# Patient Record
Sex: Female | Born: 1955 | Race: White | Hispanic: No | Marital: Single | Smoking: Never smoker
Health system: Southern US, Community
[De-identification: ages and names within clinical notes are randomized; demographics above are authoritative.]

## PROBLEM LIST (undated history)

## (undated) DIAGNOSIS — J45909 Unspecified asthma, uncomplicated: Secondary | ICD-10-CM

## (undated) DIAGNOSIS — I10 Essential (primary) hypertension: Secondary | ICD-10-CM

## (undated) DIAGNOSIS — E669 Obesity, unspecified: Secondary | ICD-10-CM

## (undated) DIAGNOSIS — E119 Type 2 diabetes mellitus without complications: Secondary | ICD-10-CM

## (undated) HISTORY — DX: Obesity, unspecified: E66.9

## (undated) HISTORY — DX: Essential (primary) hypertension: I10

## (undated) HISTORY — DX: Unspecified asthma, uncomplicated: J45.909

## (undated) HISTORY — DX: Type 2 diabetes mellitus without complications: E11.9

---
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
## 2008-01-30 ENCOUNTER — Encounter: Admission: RE | Admit: 2008-01-30 | Discharge: 2008-01-30 | Payer: Self-pay | Admitting: Family Medicine

## 2008-02-12 ENCOUNTER — Encounter: Admission: RE | Admit: 2008-02-12 | Discharge: 2008-02-12 | Payer: Self-pay | Admitting: Family Medicine

## 2008-08-05 ENCOUNTER — Encounter: Admission: RE | Admit: 2008-08-05 | Discharge: 2008-08-05 | Payer: Self-pay | Admitting: Family Medicine

## 2009-02-24 ENCOUNTER — Encounter: Admission: RE | Admit: 2009-02-24 | Discharge: 2009-02-24 | Payer: Self-pay | Admitting: Family Medicine

## 2010-03-10 ENCOUNTER — Encounter: Admission: RE | Admit: 2010-03-10 | Discharge: 2010-03-10 | Payer: Self-pay | Admitting: Family Medicine

## 2011-05-11 ENCOUNTER — Other Ambulatory Visit: Payer: Self-pay | Admitting: Family Medicine

## 2011-05-11 DIAGNOSIS — Z1231 Encounter for screening mammogram for malignant neoplasm of breast: Secondary | ICD-10-CM

## 2011-05-22 ENCOUNTER — Ambulatory Visit
Admission: RE | Admit: 2011-05-22 | Discharge: 2011-05-22 | Disposition: A | Discharge status: Home / Self Care | Payer: BC Managed Care – PPO | Source: Ambulatory Visit | Attending: Family Medicine | Admitting: Family Medicine

## 2011-05-22 DIAGNOSIS — Z1231 Encounter for screening mammogram for malignant neoplasm of breast: Secondary | ICD-10-CM

## 2012-06-11 ENCOUNTER — Other Ambulatory Visit: Payer: Self-pay | Admitting: Family Medicine

## 2012-06-11 DIAGNOSIS — Z1231 Encounter for screening mammogram for malignant neoplasm of breast: Secondary | ICD-10-CM

## 2012-07-25 ENCOUNTER — Ambulatory Visit
Admission: RE | Admit: 2012-07-25 | Discharge: 2012-07-25 | Disposition: A | Discharge status: Home / Self Care | Payer: BC Managed Care – PPO | Source: Ambulatory Visit | Attending: Family Medicine | Admitting: Family Medicine

## 2012-07-25 DIAGNOSIS — Z1231 Encounter for screening mammogram for malignant neoplasm of breast: Secondary | ICD-10-CM

## 2013-09-26 ENCOUNTER — Other Ambulatory Visit: Payer: Self-pay

## 2013-09-26 DIAGNOSIS — Z1231 Encounter for screening mammogram for malignant neoplasm of breast: Secondary | ICD-10-CM

## 2013-10-20 ENCOUNTER — Ambulatory Visit
Admission: RE | Admit: 2013-10-20 | Discharge: 2013-10-20 | Disposition: A | Discharge status: Home / Self Care | Payer: BC Managed Care – PPO | Source: Ambulatory Visit

## 2013-10-20 DIAGNOSIS — Z1231 Encounter for screening mammogram for malignant neoplasm of breast: Secondary | ICD-10-CM

## 2014-09-25 ENCOUNTER — Other Ambulatory Visit: Payer: Self-pay

## 2014-09-25 DIAGNOSIS — Z1231 Encounter for screening mammogram for malignant neoplasm of breast: Secondary | ICD-10-CM

## 2014-10-26 ENCOUNTER — Ambulatory Visit: Payer: BC Managed Care – PPO

## 2014-11-20 ENCOUNTER — Ambulatory Visit: Payer: BC Managed Care – PPO

## 2014-11-24 ENCOUNTER — Ambulatory Visit: Payer: BC Managed Care – PPO

## 2014-12-11 ENCOUNTER — Ambulatory Visit: Payer: BC Managed Care – PPO

## 2014-12-22 ENCOUNTER — Ambulatory Visit
Admission: RE | Admit: 2014-12-22 | Discharge: 2014-12-22 | Disposition: A | Discharge status: Home / Self Care | Payer: BC Managed Care – PPO | Source: Ambulatory Visit

## 2014-12-22 DIAGNOSIS — Z1231 Encounter for screening mammogram for malignant neoplasm of breast: Secondary | ICD-10-CM

## 2015-03-25 ENCOUNTER — Encounter: Payer: BC Managed Care – PPO | Attending: Family Medicine

## 2015-03-25 VITALS — Ht 62.5 in | Wt 223.6 lb

## 2015-03-25 DIAGNOSIS — Z713 Dietary counseling and surveillance: Secondary | ICD-10-CM | POA: Diagnosis not present

## 2015-03-25 DIAGNOSIS — E119 Type 2 diabetes mellitus without complications: Secondary | ICD-10-CM | POA: Insufficient documentation

## 2015-03-27 NOTE — Progress Notes (Signed)
Patient was seen on 03/27/15 for the first of a series of three diabetes self-management courses at the [facility].  Patient Education Plan per assessed needs and concerns is to attend four course education program for Diabetes Self Management Education.  The following learning objectives were met by the patient during this class:  Describe diabetes  State some common risk factors for diabetes  Defines the role of glucose and insulin  Identifies type of diabetes and pathophysiology  Describe the relationship between diabetes and cardiovascular risk  State the members of the Healthcare Team  States the rationale for glucose monitoring  State when to test glucose  State their individual Target Range  State the importance of logging glucose readings  Describe how to interpret glucose readings  Identifies A1C target  Explain the correlation between A1c and eAG values  State symptoms and treatment of high blood glucose  State symptoms and treatment of low blood glucose  Explain proper technique for glucose testing  Identifies proper sharps disposal  Handouts given during class include:  Living Well with Diabetes book  Carb Counting and Meal Planning book  Meal Plan Card  Carbohydrate guide  Meal planning worksheet  Low Sodium Flavoring Tips  The diabetes portion plate  N4O to eAG Conversion Chart  Diabetes Medications  Diabetes Recommended Care Schedule  Support Group  Diabetes Success Plan  Core Class Satisfaction Survey  Follow-Up Plan:  Attend core 2

## 2015-04-01 DIAGNOSIS — E119 Type 2 diabetes mellitus without complications: Secondary | ICD-10-CM

## 2015-04-08 DIAGNOSIS — E119 Type 2 diabetes mellitus without complications: Secondary | ICD-10-CM

## 2015-04-08 NOTE — Progress Notes (Signed)

## 2015-04-13 NOTE — Progress Notes (Signed)
Patient was seen on 04/08/15 for the third of a series of three diabetes self-management courses at the [facility].   Janene Madeira the amount of activity recommended for healthy living . Describe activities suitable for individual needs . Identify ways to regularly incorporate activity into daily life . Identify barriers to activity and ways to over come these barriers  Identify diabetes medications being personally used and their primary action for lowering glucose and possible side effects . Describe role of stress on blood glucose and develop strategies to address psychosocial issues . Identify diabetes complications and ways to prevent them  Explain how to manage diabetes during illness . Evaluate success in meeting personal goal . Establish 2-3 goals that they will plan to diligently work on until they return for the  37-month follow-up visit  Goals:   I will count my carb choices at most meals and snacks  Prepare more of my own food  I will be active 30 minutes or more 5 times a week  Prepare for Alzheimer Walk/Run in September 2017  I will take my diabetes medications as scheduled  I will eat less unhealthy fats by eating less fried food, bad fats  Sleep more  To help manage stress I will  exercise at least 5 times a week  Go to a movie  Your patient has identified these potential barriers to change:  Motivation Finances Stress Lack of Family Support  Your patient has identified their diabetes self-care support plan as  [facility] Support Group Family Education officer, environmental Resources Plan:  Attend Optional Core 4 in 4 months

## 2015-12-30 ENCOUNTER — Other Ambulatory Visit: Payer: Self-pay | Admitting: Family Medicine

## 2015-12-30 DIAGNOSIS — Z139 Encounter for screening, unspecified: Secondary | ICD-10-CM

## 2016-01-19 ENCOUNTER — Ambulatory Visit: Payer: BC Managed Care – PPO

## 2016-02-18 ENCOUNTER — Ambulatory Visit: Payer: BC Managed Care – PPO

## 2016-02-25 ENCOUNTER — Ambulatory Visit
Admission: RE | Admit: 2016-02-25 | Discharge: 2016-02-25 | Disposition: A | Discharge status: Home / Self Care | Payer: BC Managed Care – PPO | Source: Ambulatory Visit | Attending: Family Medicine | Admitting: Family Medicine

## 2016-02-25 DIAGNOSIS — Z139 Encounter for screening, unspecified: Secondary | ICD-10-CM

## 2017-04-11 ENCOUNTER — Other Ambulatory Visit: Payer: Self-pay | Admitting: Family Medicine

## 2017-04-11 DIAGNOSIS — Z1239 Encounter for other screening for malignant neoplasm of breast: Secondary | ICD-10-CM

## 2017-04-20 ENCOUNTER — Ambulatory Visit
Admission: RE | Admit: 2017-04-20 | Discharge: 2017-04-20 | Disposition: A | Discharge status: Home / Self Care | Payer: BC Managed Care – PPO | Source: Ambulatory Visit | Attending: Family Medicine | Admitting: Family Medicine

## 2017-04-20 DIAGNOSIS — Z1239 Encounter for other screening for malignant neoplasm of breast: Secondary | ICD-10-CM

## 2018-03-29 ENCOUNTER — Other Ambulatory Visit: Payer: Self-pay | Admitting: Family Medicine

## 2018-03-29 DIAGNOSIS — Z1231 Encounter for screening mammogram for malignant neoplasm of breast: Secondary | ICD-10-CM

## 2018-04-25 ENCOUNTER — Ambulatory Visit
Admission: RE | Admit: 2018-04-25 | Discharge: 2018-04-25 | Disposition: A | Discharge status: Home / Self Care | Payer: BC Managed Care – PPO | Source: Ambulatory Visit | Attending: Family Medicine | Admitting: Family Medicine

## 2018-04-25 DIAGNOSIS — Z1231 Encounter for screening mammogram for malignant neoplasm of breast: Secondary | ICD-10-CM

## 2019-01-02 ENCOUNTER — Other Ambulatory Visit: Payer: Self-pay | Admitting: Nurse Practitioner

## 2019-01-02 DIAGNOSIS — Z1231 Encounter for screening mammogram for malignant neoplasm of breast: Secondary | ICD-10-CM

## 2019-01-02 DIAGNOSIS — E2839 Other primary ovarian failure: Secondary | ICD-10-CM

## 2019-04-28 ENCOUNTER — Ambulatory Visit
Admission: RE | Admit: 2019-04-28 | Discharge: 2019-04-28 | Disposition: A | Discharge status: Home / Self Care | Payer: Medicare Other | Source: Ambulatory Visit | Attending: Nurse Practitioner | Admitting: Nurse Practitioner

## 2019-04-28 ENCOUNTER — Other Ambulatory Visit: Payer: Self-pay

## 2019-04-28 DIAGNOSIS — Z1231 Encounter for screening mammogram for malignant neoplasm of breast: Secondary | ICD-10-CM

## 2019-04-28 DIAGNOSIS — E2839 Other primary ovarian failure: Secondary | ICD-10-CM

## 2019-08-30 ENCOUNTER — Ambulatory Visit: Payer: Medicare PPO | Attending: Internal Medicine

## 2019-08-30 DIAGNOSIS — Z23 Encounter for immunization: Secondary | ICD-10-CM | POA: Insufficient documentation

## 2019-08-30 NOTE — Progress Notes (Signed)
   Covid-19 Vaccination Clinic  Name:  Hensley Aziz    MRN: [id number] DOB: October 16, 1955  08/30/2019  Ms. Alegria was observed post Covid-19 immunization for  15 minutes without incidence. She was provided with Vaccine Information Sheet and instruction to access the V-Safe system.   Ms. Vetere was instructed to call 911 with any severe reactions post vaccine: Marland Kitchen Difficulty breathing  . Swelling of your face and throat  . A fast heartbeat  . A bad rash all over your body  . Dizziness and weakness    Immunizations Administered    Name Date Dose VIS Date Route   Pfizer COVID-19 Vaccine 08/30/2019  9:20 AM 0.3 mL 06/27/2019 Intramuscular   Manufacturer: [employer], [employer]   Lot: [id number]   NDC: [address]-9

## 2019-09-22 ENCOUNTER — Ambulatory Visit: Payer: Medicare PPO | Attending: Internal Medicine

## 2019-09-22 DIAGNOSIS — Z23 Encounter for immunization: Secondary | ICD-10-CM | POA: Insufficient documentation

## 2019-09-22 NOTE — Progress Notes (Signed)
   Covid-19 Vaccination Clinic  Name:  Michelle Henderson    MRN: [id number] DOB: Jun 05, 1956  09/22/2019  [name] was observed post Covid-19 immunization for 15 minutes without incident. She was provided with Vaccine Information Sheet and instruction to access the V-Safe system.   [name] was instructed to call 911 with any severe reactions post vaccine: Marland Kitchen Difficulty breathing  . Swelling of face and throat  . A fast heartbeat  . A bad rash all over body  . Dizziness and weakness   Immunizations Administered    Name Date Dose VIS Date Route   Pfizer COVID-19 Vaccine 09/22/2019 12:30 PM 0.3 mL 06/27/2019 Intramuscular   Manufacturer: [employer], [employer]   Lot: [id number]   NDC: [address]-6

## 2020-04-02 ENCOUNTER — Other Ambulatory Visit: Payer: Self-pay | Admitting: Nurse Practitioner

## 2020-04-02 DIAGNOSIS — Z1231 Encounter for screening mammogram for malignant neoplasm of breast: Secondary | ICD-10-CM

## 2020-04-28 ENCOUNTER — Ambulatory Visit: Payer: Medicare PPO

## 2020-05-19 ENCOUNTER — Ambulatory Visit
Admission: RE | Admit: 2020-05-19 | Discharge: 2020-05-19 | Disposition: A | Discharge status: Home / Self Care | Payer: Medicare PPO | Source: Ambulatory Visit | Attending: Nurse Practitioner | Admitting: Nurse Practitioner

## 2020-05-19 ENCOUNTER — Other Ambulatory Visit: Payer: Self-pay

## 2020-05-19 DIAGNOSIS — Z1231 Encounter for screening mammogram for malignant neoplasm of breast: Secondary | ICD-10-CM

## 2021-04-27 ENCOUNTER — Other Ambulatory Visit: Payer: Self-pay | Admitting: Family Medicine

## 2021-04-27 DIAGNOSIS — Z1231 Encounter for screening mammogram for malignant neoplasm of breast: Secondary | ICD-10-CM

## 2021-05-31 ENCOUNTER — Ambulatory Visit
Admission: RE | Admit: 2021-05-31 | Discharge: 2021-05-31 | Disposition: A | Discharge status: Home / Self Care | Payer: Medicare PPO | Source: Ambulatory Visit

## 2021-05-31 DIAGNOSIS — Z1231 Encounter for screening mammogram for malignant neoplasm of breast: Secondary | ICD-10-CM

## 2022-04-17 ENCOUNTER — Other Ambulatory Visit: Payer: Self-pay | Admitting: Family Medicine

## 2022-04-17 DIAGNOSIS — Z1231 Encounter for screening mammogram for malignant neoplasm of breast: Secondary | ICD-10-CM

## 2022-06-01 ENCOUNTER — Ambulatory Visit: Payer: Medicare PPO

## 2022-06-02 ENCOUNTER — Ambulatory Visit
Admission: RE | Admit: 2022-06-02 | Discharge: 2022-06-02 | Disposition: A | Discharge status: Home / Self Care | Payer: Medicare PPO | Source: Ambulatory Visit | Attending: Family Medicine | Admitting: Family Medicine

## 2022-06-02 DIAGNOSIS — Z1231 Encounter for screening mammogram for malignant neoplasm of breast: Secondary | ICD-10-CM

## 2022-07-09 ENCOUNTER — Encounter (HOSPITAL_COMMUNITY): Payer: Self-pay

## 2022-07-09 ENCOUNTER — Emergency Department (HOSPITAL_COMMUNITY): Payer: Medicare PPO

## 2022-07-09 ENCOUNTER — Observation Stay (HOSPITAL_COMMUNITY)
Admission: EM | Admit: 2022-07-09 | Discharge: 2022-07-10 | Disposition: A | Discharge status: Home / Self Care | Payer: Medicare PPO | Attending: Internal Medicine | Admitting: Internal Medicine

## 2022-07-09 ENCOUNTER — Other Ambulatory Visit: Payer: Self-pay

## 2022-07-09 DIAGNOSIS — E860 Dehydration: Secondary | ICD-10-CM | POA: Insufficient documentation

## 2022-07-09 DIAGNOSIS — L03211 Cellulitis of face: Principal | ICD-10-CM | POA: Insufficient documentation

## 2022-07-09 DIAGNOSIS — Z7984 Long term (current) use of oral hypoglycemic drugs: Secondary | ICD-10-CM | POA: Insufficient documentation

## 2022-07-09 DIAGNOSIS — Z1152 Encounter for screening for COVID-19: Secondary | ICD-10-CM | POA: Diagnosis not present

## 2022-07-09 DIAGNOSIS — E119 Type 2 diabetes mellitus without complications: Secondary | ICD-10-CM | POA: Insufficient documentation

## 2022-07-09 DIAGNOSIS — J45909 Unspecified asthma, uncomplicated: Secondary | ICD-10-CM | POA: Diagnosis not present

## 2022-07-09 DIAGNOSIS — R221 Localized swelling, mass and lump, neck: Secondary | ICD-10-CM | POA: Diagnosis present

## 2022-07-09 DIAGNOSIS — D72828 Other elevated white blood cell count: Secondary | ICD-10-CM | POA: Insufficient documentation

## 2022-07-09 DIAGNOSIS — R000 Tachycardia, unspecified: Secondary | ICD-10-CM | POA: Diagnosis not present

## 2022-07-09 DIAGNOSIS — K1120 Sialoadenitis, unspecified: Secondary | ICD-10-CM | POA: Diagnosis not present

## 2022-07-09 DIAGNOSIS — I10 Essential (primary) hypertension: Secondary | ICD-10-CM | POA: Diagnosis not present

## 2022-07-09 DIAGNOSIS — E669 Obesity, unspecified: Secondary | ICD-10-CM | POA: Diagnosis present

## 2022-07-09 LAB — CBC WITH DIFFERENTIAL/PLATELET
Abs Immature Granulocytes: 0.05 10*3/uL (ref 0.00–0.07)
Basophils Absolute: 0.1 10*3/uL (ref 0.0–0.1)
Basophils Relative: 1 %
Eosinophils Absolute: 0.2 10*3/uL (ref 0.0–0.5)
Eosinophils Relative: 2 %
HCT: 38.3 % (ref 36.0–46.0)
Hemoglobin: 13.1 g/dL (ref 12.0–15.0)
Immature Granulocytes: 0 %
Lymphocytes Relative: 13 %
Lymphs Abs: 1.8 10*3/uL (ref 0.7–4.0)
MCH: 31.7 pg (ref 26.0–34.0)
MCHC: 34.2 g/dL (ref 30.0–36.0)
MCV: 92.7 fL (ref 80.0–100.0)
Monocytes Absolute: 1.4 10*3/uL — ABNORMAL HIGH (ref 0.1–1.0)
Monocytes Relative: 10 %
Neutro Abs: 10.5 10*3/uL — ABNORMAL HIGH (ref 1.7–7.7)
Neutrophils Relative %: 74 %
Platelets: 285 10*3/uL (ref 150–400)
RBC: 4.13 MIL/uL (ref 3.87–5.11)
RDW: 13 % (ref 11.5–15.5)
WBC: 14 10*3/uL — ABNORMAL HIGH (ref 4.0–10.5)
nRBC: 0 % (ref 0.0–0.2)

## 2022-07-09 LAB — BASIC METABOLIC PANEL
Anion gap: 11 (ref 5–15)
BUN: 23 mg/dL (ref 8–23)
CO2: 22 mmol/L (ref 22–32)
Calcium: 10 mg/dL (ref 8.9–10.3)
Chloride: 102 mmol/L (ref 98–111)
Creatinine, Ser: 1.06 mg/dL — ABNORMAL HIGH (ref 0.44–1.00)
GFR, Estimated: 57 mL/min — ABNORMAL LOW (ref >60)
Glucose, Bld: 125 mg/dL — ABNORMAL HIGH (ref 70–99)
Potassium: 3.9 mmol/L (ref 3.5–5.1)
Sodium: 135 mmol/L (ref 135–145)

## 2022-07-09 LAB — RESP PANEL BY RT-PCR (RSV, FLU A&B, COVID)  RVPGX2
Influenza A by PCR: NEGATIVE
Influenza B by PCR: NEGATIVE
Resp Syncytial Virus by PCR: NEGATIVE
SARS Coronavirus 2 by RT PCR: NEGATIVE

## 2022-07-09 LAB — LACTIC ACID, PLASMA: Lactic Acid, Venous: 1.1 mmol/L (ref 0.5–1.9)

## 2022-07-09 LAB — GLUCOSE, CAPILLARY: Glucose-Capillary: 140 mg/dL — ABNORMAL HIGH (ref 70–99)

## 2022-07-09 MED ORDER — ORAL CARE MOUTH RINSE: 15.0000 mL | OROMUCOSAL | Status: DC | PRN

## 2022-07-09 MED ORDER — SODIUM CHLORIDE 0.9 % IV BOLUS
1000.0000 mL | Freq: Once | INTRAVENOUS | Status: AC
  Administered 2022-07-09: 1000 mL via INTRAVENOUS

## 2022-07-09 MED ORDER — INSULIN ASPART 100 UNIT/ML IJ SOLN
0.0000 [IU] | INTRAMUSCULAR | Status: DC
  Administered 2022-07-09 – 2022-07-10 (×3): 1 [IU] via SUBCUTANEOUS
  Filled 2022-07-09: qty 0.09

## 2022-07-09 MED ORDER — IOHEXOL 300 MG/ML  SOLN
75.0000 mL | Freq: Once | INTRAMUSCULAR | Status: AC | PRN
  Administered 2022-07-09: 75 mL via INTRAVENOUS

## 2022-07-09 MED ORDER — CLINDAMYCIN PHOSPHATE 600 MG/50ML IV SOLN
600.0000 mg | Freq: Once | INTRAVENOUS | Status: AC
  Administered 2022-07-09: 600 mg via INTRAVENOUS
  Filled 2022-07-09: qty 50

## 2022-07-09 MED ORDER — CLINDAMYCIN PHOSPHATE 600 MG/50ML IV SOLN
600.0000 mg | Freq: Three times a day (TID) | INTRAVENOUS | Status: DC
  Administered 2022-07-10: 600 mg via INTRAVENOUS
  Filled 2022-07-09 (×2): qty 50

## 2022-07-09 NOTE — Assessment & Plan Note (Signed)
Will rehydrate and follow fluid status °

## 2022-07-09 NOTE — Assessment & Plan Note (Signed)
In the setting of acute illness Patient is not febrile We will rehydrate and follow fluid status Check tsh

## 2022-07-09 NOTE — Assessment & Plan Note (Signed)
Order sliding scale hold p.o. medications for now 

## 2022-07-09 NOTE — ED Provider Triage Note (Signed)
Emergency Medicine Provider Triage Evaluation Note  Michelle Henderson , a 66 y.o. female  was evaluated in triage.  Pt complains of left sided neck swelling x 4 days. Preceded by 2 days of sinus type headache that has resolved. No fever, nausea, vomiting, difficulty swallowing. Tolerating secretions. No SOB or chest pain. Was seen by dentist for symptoms 2 days ago, no abnormalities found on dental exam.   Review of Systems  Positive: See HPI Negative: See HPI  Physical Exam  BP (!) 153/73 (BP Location: Left Arm)   Pulse 84   Temp 98.1 F (36.7 C) (Oral)   Resp 20   Ht 5\' 1"  (1.549 m)   Wt 94.8 kg   SpO2 98%   BMI 39.49 kg/m  Gen:   Awake, no distress   Resp:  Normal effort LCTA MSK:   Moves extremities without difficulty  Other:  Left sided neck asymmetry compared to right side, tenderness to palpation with palpable soft tissue swelling to left neck, airway patent, tolerating secretions, no intra-oral swelling, lesions, erythema, or other abnormalities noted  Medical Decision Making  Medically screening exam initiated at 3:15 PM.  Appropriate orders placed.  Michelle Henderson was informed that the remainder of the evaluation will be completed by another provider, this initial triage assessment does not replace that evaluation, and the importance of remaining in the ED until their evaluation is complete.     Karyl Kinnier, PA-C 07/09/22 1518

## 2022-07-09 NOTE — Assessment & Plan Note (Signed)
Allow permissive hypertension for tonight 

## 2022-07-09 NOTE — Assessment & Plan Note (Signed)
Due to  left submandibular sialoadenitis  In the ER started on clindamycin we will continue If no improvement may need further consult with ENT. Observe overnight to make sure there is no airway compromise.

## 2022-07-09 NOTE — Assessment & Plan Note (Signed)
--   Follow up as an outpatient 

## 2022-07-09 NOTE — Subjective & Objective (Signed)
4-day history of swelling and pain on the left side of her face.  Has been having a funny feeling on left side of her face and also have had a headache.  She was seen by dentist on Friday in did not notice any issues.  But continued to have significant swelling on the left side of her face and jaw going down to her chin. Patient has history of diabetes Not had any fevers or chills nausea or vomiting. No prior history similar to this Now able to swallow fluids without any problems.

## 2022-07-09 NOTE — H&P (Signed)
Michelle Kinnieratricia Evelyn Rowlands [id number] DOB: 1955/12/16 DOA: 07/09/2022     PCP: Stevphen RochesterManfredi, Brenda L, MD   Outpatient Specialists: NONE    Patient arrived to ER on 07/09/22 at 1437 Referred by Attending Melene PlanFloyd, Dan, DO   Patient coming from:    home Lives alone,    Chief Complaint:   Chief Complaint  Patient presents with   Facial Swelling    HPI: Michelle Kinnieratricia Evelyn Lisle is a 66 y.o. female with medical history significant of   DM 2, obesity HTN, HLD,asthma, arthritis    Presented with   left side facial swelling 4-day history of swelling and pain on the left side of her face.  Has been having a funny feeling on left side of her face and also have had a headache.  She was seen by dentist on Friday in did not notice any issues.  But continued to have significant swelling on the left side of her face and jaw going down to her chin. Patient has history of diabetes Not had any fevers or chills nausea or vomiting. No prior history similar to this Now able to swallow fluids without any problems.    Does not smoke drinks on hollydays   Initial COVID TEST  NEGATIVE   Lab Results  Component Value Date   SARSCOV2NAA NEGATIVE 07/09/2022     Regarding pertinent Chronic problems:      HTN on HCTZ, lisinopril       DM 2 - No results found for: "HGBA1C" PO meds only,    Ozempic   obesity-   BMI Readings from Last 1 Encounters:  07/09/22 39.49 kg/m       Asthma -well   controlled on home inhalers/ nebs f                          While in ER: Clinical Course as of 07/09/22 2044  Sun Jul 09, 2022  1743 WBC(!): 14.0 [AH]    Clinical Course User Index [AH] Arthor CaptainHarris, Abigail, PA-C    On clindamycin IV   Ordered  CT soft tissue 1. Findings consistent with acute left submandibular sialoadenitis with regional cellulitis. No visible obstructive stone. Inflammatory changes extend to partially surround the left mandibular body, with note made of a prominent dental carie  about the left third mandibular molar. Unclear whether this is contributory to the underlying infection. Correlation with physical exam recommended. No discrete abscess or drainable fluid collection.  Following Medications were ordered in ER: Medications  clindamycin (CLEOCIN) IVPB 600 mg (has no administration in time range)  sodium chloride 0.9 % bolus 1,000 mL (0 mLs Intravenous Stopped 07/09/22 1854)  sodium chloride 0.9 % bolus 1,000 mL (1,000 mLs Intravenous New Bag/Given 07/09/22 1854)  iohexol (OMNIPAQUE) 300 MG/ML solution 75 mL (75 mLs Intravenous Contrast Given 07/09/22 1915)    _______________  ED Triage Vitals  Enc Vitals Group     BP 07/09/22 1445 (!) 153/73     Pulse Rate 07/09/22 1445 84     Resp 07/09/22 1445 20     Temp 07/09/22 1445 98.1 F (36.7 C)     Temp Source 07/09/22 1445 Oral     SpO2 07/09/22 1445 98 %     Weight 07/09/22 1449 209 lb (94.8 kg)     Height 07/09/22 1449 5\' 1"  (1.549 m)     Head Circumference --      Peak Flow --  Pain Score 07/09/22 1449 5     Pain Loc --      Pain Edu? --      Excl. in GC? --   TMAX(24)@     _________________________________________ Significant initial  Findings: Abnormal Labs Reviewed  CBC WITH DIFFERENTIAL/PLATELET - Abnormal; Notable for the following components:      Result Value   WBC 14.0 (*)    Neutro Abs 10.5 (*)    Monocytes Absolute 1.4 (*)    All other components within normal limits  BASIC METABOLIC PANEL - Abnormal; Notable for the following components:   Glucose, Bld 125 (*)    Creatinine, Ser 1.06 (*)    GFR, Estimated 57 (*)    All other components within normal limits    ECG: Ordered Personally reviewed and interpreted by me showing: HR : 115 Rhythm:Sinus tachycardia Consider right atrial enlargement Low voltage, precordial leads QTC 418       The recent clinical data is shown below. Vitals:   07/09/22 1557 07/09/22 1630 07/09/22 1810 07/09/22 1838  BP: (!) 151/86 (!) 143/91  135/89   Pulse: (!) 125 (!) 123 (!) 114   Resp: 20 20 20    Temp:    98.9 F (37.2 C)  TempSrc:    Oral  SpO2: 100% 100% 100%   Weight:      Height:         WBC     Component Value Date/Time   WBC 14.0 (H) 07/09/2022 1705   LYMPHSABS 1.8 07/09/2022 1705   MONOABS 1.4 (H) 07/09/2022 1705   EOSABS 0.2 07/09/2022 1705   BASOSABS 0.1 07/09/2022 1705     Lactic Acid, Venous    Component Value Date/Time   LATICACIDVEN 1.1 07/09/2022 1705     Procalcitonin   Ordered    Results for orders placed or performed during the hospital encounter of 07/09/22  Resp panel by RT-PCR (RSV, Flu A&B, Covid) Anterior Nasal Swab     Status: None   Collection Time: 07/09/22  5:02 PM   Specimen: Anterior Nasal Swab  Result Value Ref Range Status   SARS Coronavirus 2 by RT PCR NEGATIVE NEGATIVE Final         Influenza A by PCR NEGATIVE NEGATIVE Final   Influenza B by PCR NEGATIVE NEGATIVE Final         Resp Syncytial Virus by PCR NEGATIVE NEGATIVE Final          _______________________________________________ Hospitalist was called for admission for  facial cellulitis    The following Work up has been ordered so far:  Orders Placed This Encounter  Procedures   Resp panel by RT-PCR (RSV, Flu A&B, Covid) Anterior Nasal Swab   CT Soft Tissue Neck W Contrast   CBC with Differential   Basic metabolic panel   Consult to hospitalist     OTHER Significant initial  Findings:  labs showing:  Recent Labs  Lab 07/09/22 1705  NA 135  K 3.9  CO2 22  GLUCOSE 125*  BUN 23  CREATININE 1.06*  CALCIUM 10.0    Cr  stable,    Lab Results  Component Value Date   CREATININE 1.06 (H) 07/09/2022    No results for input(s): "AST", "ALT", "ALKPHOS", "BILITOT", "PROT", "ALBUMIN" in the last 168 hours. Lab Results  Component Value Date   CALCIUM 10.0 07/09/2022    Plt: Lab Results  Component Value Date   PLT 285 07/09/2022     Recent Labs  Lab  07/09/22 1705  WBC 14.0*  NEUTROABS  10.5*  HGB 13.1  HCT 38.3  MCV 92.7  PLT 285    HG/HCT stable,      Component Value Date/Time   HGB 13.1 07/09/2022 1705   HCT 38.3 07/09/2022 1705   MCV 92.7 07/09/2022 1705         Cultures: No results found for: "SDES", "SPECREQUEST", "CULT", "REPTSTATUS"   Radiological Exams on Admission: CT Soft Tissue Neck W Contrast  Result Date: 07/09/2022 CLINICAL DATA:  Initial evaluation for soft tissue infection, throat swelling. EXAM: CT NECK WITH CONTRAST TECHNIQUE: Multidetector CT imaging of the neck was performed using the standard protocol following the bolus administration of intravenous contrast. RADIATION DOSE REDUCTION: This exam was performed according to the departmental dose-optimization program which includes automated exposure control, adjustment of the mA and/or kV according to patient size and/or use of iterative reconstruction technique. CONTRAST:  31mL OMNIPAQUE IOHEXOL 300 MG/ML  SOLN COMPARISON:  None Available. FINDINGS: Pharynx and larynx: Oral cavity within normal limits. Asymmetric swelling with mucosal edema noted within the left oropharynx due to the inflammatory process within the adjacent left face/neck. Supraglottic airway remains patent. No retropharyngeal collection. Negative epiglottis. Glottis normal. Subglottic airway patent clear. Salivary glands: Parotid glands within normal limits. Normal right submandibular gland. Left submandibular gland is asymmetrically enlarged with heterogeneous and irregular appearance, suggestive of acute sialoadenitis. No visible obstructive stone within the left submandibular duct, although evaluation somewhat limited by streak artifact from dental amalgam. No discrete abscess or drainable fluid collection. Associated soft tissue swelling with inflammatory stranding throughout the adjacent left parapharyngeal and submandibular spaces, with extension to involve the submental region. Associated swelling and edema present within the left  sublingual space as well. Inflammatory changes extend to partially surround the left mandibular body. There is a prominent dental carie at the left third mandibular molar. Unclear whether this may be contributory to the underlying infection. Thyroid: 7 mm right thyroid nodule, of doubtful significance given size and patient age, no follow-up imaging recommended (ref: J Am Coll Radiol. 2015 Feb;12(2): 143-50).Thyroid otherwise unremarkable. Lymph nodes: Prominent left upper cervical lymph nodes, largest of which measures 1 cm in short axis at level 1 B, presumably reactive. Vascular: Mild atherosclerosis about the aortic arch and carotid bifurcations. Normal intravascular enhancement seen throughout the neck. Limited intracranial: Unremarkable. Visualized orbits: Unremarkable. Mastoids and visualized paranasal sinuses: Mild scattered mucosal thickening noted about the ethmoidal air cells and right maxillary sinus. Visualized paranasal sinuses are otherwise clear. Trace right mastoid effusion noted, of doubtful significance. Middle ear cavities are clear. Skeleton: No discrete or worrisome osseous lesions. Moderate multilevel cervical spondylosis, most pronounced at C7-T1. Upper chest: Visualized upper chest demonstrates no acute finding. Partially visualized lungs are clear. Other: None. IMPRESSION: 1. Findings consistent with acute left submandibular sialoadenitis with regional cellulitis. No visible obstructive stone. Inflammatory changes extend to partially surround the left mandibular body, with note made of a prominent dental carie about the left third mandibular molar. Unclear whether this is contributory to the underlying infection. Correlation with physical exam recommended. No discrete abscess or drainable fluid collection. 2. Mildly enlarged left upper cervical lymph nodes, presumably reactive. 3. Moderate multilevel cervical spondylosis, most pronounced at C7-T1. Aortic Atherosclerosis (ICD10-I70.0).  Electronically Signed   By: Rise Mu M.D.   On: 07/09/2022 19:42   _______________________________________________________________________________________________________ Latest  Blood pressure 135/89, pulse (!) 114, temperature 98.9 F (37.2 C), temperature source Oral, resp. rate 20, height 5\' 1"  (1.549 m), weight  94.8 kg, SpO2 100 %.   Vitals  labs and radiology finding personally reviewed  Review of Systems:    Pertinent positives include:  , fatigue,  left facial swelling   Constitutional:  No weight loss, night sweats, Fevers, chills weight loss  HEENT:  No headaches, Difficulty swallowing,Tooth/dental problems,Sore throat,  No sneezing, itching, ear ache, nasal congestion, post nasal drip,  Cardio-vascular:  No chest pain, Orthopnea, PND, anasarca, dizziness, palpitations.no Bilateral lower extremity swelling  GI:  No heartburn, indigestion, abdominal pain, nausea, vomiting, diarrhea, change in bowel habits, loss of appetite, melena, blood in stool, hematemesis Resp:  no shortness of breath at rest. No dyspnea on exertion, No excess mucus, no productive cough, No non-productive cough, No coughing up of blood.No change in color of mucus.No wheezing. Skin:  no rash or lesions. No jaundice GU:  no dysuria, change in color of urine, no urgency or frequency. No straining to urinate.  No flank pain.  Musculoskeletal:  No joint pain or no joint swelling. No decreased range of motion. No back pain.  Psych:  No change in mood or affect. No depression or anxiety. No memory loss.  Neuro: no localizing neurological complaints, no tingling, no weakness, no double vision, no gait abnormality, no slurred speech, no confusion  All systems reviewed and apart from HOPI all are negative _______________________________________________________________________________________________ Past Medical History:   Past Medical History:  Diagnosis Date   Asthma    Diabetes mellitus  without complication (HCC)    Hypertension    Obesity       Past Surgical History:  Procedure Laterality Date   TONSILLECTOMY  20    Social History:   Ambulatory   independently     reports that she has never smoked. She has never used smokeless tobacco. She reports current alcohol use. She reports that she does not use drugs.   Family History:  Family History  Problem Relation Age of Onset   Breast cancer Neg Hx    ______________________________________________________________________________________________ Allergies: Allergies  Allergen Reactions   Penicillins      Prior to Admission medications   Medication Sig Start Date End Date Taking? Authorizing Provider  albuterol (PROVENTIL HFA;VENTOLIN HFA) 108 (90 BASE) MCG/ACT inhaler Inhale 2 puffs into the lungs. 01/11/15   [provider]  fexofenadine (ALLEGRA) 180 MG tablet Take 180 mg by mouth.    [provider]  glucosamine-chondroitin 500-400 MG tablet Take 2 tablets by mouth.    [provider]  hydrochlorothiazide (HYDRODIURIL) 25 MG tablet Take 25 mg by mouth. 02/15/15   [provider]  ibuprofen (ADVIL,MOTRIN) 100 MG tablet Take 200 mg by mouth.    [provider]  lisinopril (PRINIVIL,ZESTRIL) 10 MG tablet TAKE 1 TABLET (10 MG TOTAL) BY MOUTH DAILY. 02/15/15   [provider]  metFORMIN (GLUCOPHAGE-XR) 500 MG 24 hr tablet Take 500 mg by mouth. 02/16/15   [provider]  Probiotic Product (ACIDOPHILUS/GOAT MILK) CAPS Take 1 capsule by mouth.    [provider]  Vitamins/Minerals TABS Take by mouth.    [provider]    ___________________________________________________________________________________________________ Physical Exam:    07/09/2022    6:10 PM 07/09/2022    4:30 PM 07/09/2022    3:57 PM  Vitals with BMI  Systolic 135 143 161  Diastolic 89 91 86  Pulse 114 123 125     1. General:  in No  Acute distress    well    -appearing 2. Psychological: Alert and  Oriented 3. Head/ENT:    Dry Mucous Membranes                          Head Non traumatic, neck supple                           Poor Dentition  Left facial swelling 4. SKIN:  decreased Skin turgor,  Skin clean Dry and intact no rash 5. Heart: Regular rate and rhythm no  Murmur, no Rub or gallop 6. Lungs:   no wheezes or crackles   7. Abdomen: Soft,  non-tender, Non distended   obese   8. Lower extremities: no clubbing, cyanosis, no  edema 9. Neurologically Grossly intact, moving all 4 extremities equally   10. MSK: Normal range of motion    Chart has been reviewed ________  Assessment/Plan 66 y.o. female with medical history significant of   DM 2, obesity HTN, HLD,asthma, arthritis   Admitted for  eft submandibular sialoadenitis with regional cellulitis.   Present on Admission:  Facial cellulitis  Essential hypertension  Dehydration  Tachycardia  Obesity (BMI 30-39.9)    Facial cellulitis Due to  left submandibular sialoadenitis  In the ER started on clindamycin we will continue If no improvement may need further consult with ENT. Observe overnight to make sure there is no airway compromise.  Essential hypertension Allow permissive hypertension for tonight  Controlled type 2 diabetes mellitus without complication, without long-term current use of insulin (HCC) Order sliding scale hold p.o. medications for now  Dehydration Will rehydrate and follow fluid status  Tachycardia In the setting of acute illness Patient is not febrile We will rehydrate and follow fluid status Check tsh  Obesity (BMI 30-39.9) Follow-up as an outpatient   Other plan as per orders.  DVT prophylaxis:  SCD     Code Status:    Code Status: Not on file FULL CODE  as per patient   I had personally discussed CODE STATUS with patient      Family Communication:   Family not at  Bedside    Disposition Plan:        To home once workup is complete  and patient is stable   Following barriers for discharge:                                                         Pain controlled with PO medications                               , white count improving able to transition to PO antibiotics                               Consults called: none   Admission status:  ED Disposition     ED Disposition  Admit   Condition  --   Comment  The patient appears reasonably stabilized for admission considering the current resources, flow, and capabilities available in the ED at this time, and I doubt any other [facility] requiring further screening and/or treatment in the ED prior to admission is  present.  Obs     Level of care     progressive tele indefinitely please discontinue once patient no longer qualifies COVID-19 Labs    Lab Results  Component Value Date   SARSCOV2NAA NEGATIVE 07/09/2022     Precautions: admitted as   Covid Negative     Toben Acuna 07/09/2022, 10:13 PM    [facility]     after 2 AM please page floor coverage PA If 7AM-7PM, please contact the day team taking care of the patient using [email or web address]   Patient was evaluated in the context of the global COVID-19 pandemic, which necessitated consideration that the patient might be at risk for infection with the SARS-CoV-2 virus that causes COVID-19. Institutional protocols and algorithms that pertain to the evaluation of patients at risk for COVID-19 are in a state of rapid change based on information released by regulatory bodies including the CDC and federal and state organizations. These policies and algorithms were followed during the patient's care.

## 2022-07-09 NOTE — ED Provider Notes (Signed)
[name]   CSN: [id number] Arrival date & time: 07/09/22  1437     History  Chief Complaint  Patient presents with   Facial Swelling    Michelle Henderson is a 66 y.o. female who presents emergency department with a chief complaint of swelling in the neck.  She had onset of swelling 4 days ago.  She saw her dentist and had x-rays that were negative.  She has had progressively worsening pain and swelling under the angle of the mandible on the left and now significant firmness distention of the hypoglossal/submandibular region.  She is diabetic.  She denies fever, chills, nausea or vomiting.  She is never had anything like this before.  She denies pain with swallowing, difficulty tolerating fluids  HPI     Home Medications Prior to Admission medications   Medication Sig Start Date End Date Taking? Authorizing Provider  albuterol (PROVENTIL HFA;VENTOLIN HFA) 108 (90 BASE) MCG/ACT inhaler Inhale 2 puffs into the lungs. 01/11/15   [provider]  fexofenadine (ALLEGRA) 180 MG tablet Take 180 mg by mouth.    [provider]  glucosamine-chondroitin 500-400 MG tablet Take 2 tablets by mouth.    [provider]  hydrochlorothiazide (HYDRODIURIL) 25 MG tablet Take 25 mg by mouth. 02/15/15   [provider]  ibuprofen (ADVIL,MOTRIN) 100 MG tablet Take 200 mg by mouth.    [provider]  lisinopril (PRINIVIL,ZESTRIL) 10 MG tablet TAKE 1 TABLET (10 MG TOTAL) BY MOUTH DAILY. 02/15/15   [provider]  metFORMIN (GLUCOPHAGE-XR) 500 MG 24 hr tablet Take 500 mg by mouth. 02/16/15   [provider]  Probiotic Product (ACIDOPHILUS/GOAT MILK) CAPS Take 1 capsule by mouth.    [provider]  Vitamins/Minerals TABS Take by mouth.    [provider]      Allergies    Penicillins    Review of Systems   Review of Systems  Physical Exam Updated Vital Signs BP (!)  143/91   Pulse (!) 123   Temp 98.1 F (36.7 C) (Oral)   Resp 20   Ht 5\' 1"  (1.549 m)   Wt 94.8 kg   SpO2 100%   BMI 39.49 kg/m  Physical Exam Vitals and nursing note reviewed.  Constitutional:      General: She is not in acute distress.    Appearance: She is well-developed. She is not diaphoretic.  HENT:     Head: Normocephalic and atraumatic.     Right Ear: External ear normal.     Left Ear: External ear normal.     Nose: Nose normal.     Mouth/Throat:     Mouth: Mucous membranes are moist.     Pharynx: Oropharynx is clear. Uvula midline.     Comments: Dentition does not appear to be infected.  No swelling of the gumline, no swelling that is palpable under the tongue oropharynx clear and moist Eyes:     General: No scleral icterus.    Extraocular Movements: Extraocular movements intact.     Conjunctiva/sclera: Conjunctivae normal.     Pupils: Pupils are equal, round, and reactive to light.  Neck:     Comments: Firm tender indurated fullness under the left side of the submandibular region, tender to palpation Cardiovascular:     Rate and Rhythm: Normal rate and regular rhythm.     Heart sounds: Normal heart sounds. No murmur heard.    No friction rub. No gallop.  Pulmonary:     Effort: Pulmonary effort is normal. No respiratory distress.     Breath sounds: Normal breath sounds.  Abdominal:     General: Bowel sounds are normal. There is no distension.     Palpations: Abdomen is soft. There is no mass.     Tenderness: There is no abdominal tenderness. There is no guarding.  Skin:    General: Skin is warm and dry.  Neurological:     Mental Status: She is alert and oriented to person, place, and time.  Psychiatric:        Behavior: Behavior normal.     ED Results / Procedures / Treatments   Labs (all labs ordered are listed, but only abnormal results are displayed) Labs Reviewed  CBC WITH DIFFERENTIAL/PLATELET - Abnormal; Notable for the following components:       Result Value   WBC 14.0 (*)    Neutro Abs 10.5 (*)    Monocytes Absolute 1.4 (*)    All other components within normal limits  RESP PANEL BY RT-PCR (RSV, FLU A&B, COVID)  [id number]  BASIC METABOLIC PANEL  LACTIC ACID, PLASMA  LACTIC ACID, PLASMA    EKG None  Radiology No results found.  Procedures Procedures    Medications Ordered in ED Medications  sodium chloride 0.9 % bolus 1,000 mL (1,000 mLs Intravenous New Bag/Given 07/09/22 1707)    ED Course/ Medical Decision Making/ A&P Clinical Course as of 07/09/22 1744  Sun Jul 09, 2022  1743 WBC(!): 14.0 [AH]    Clinical Course User Index [AH] Arthor Captain, PA-C                           Medical Decision Making 66 year old female who presents emergency department with chief complaint of swelling in her submandibular region.  Differential diagnosis includes peritonsillar abscess, mumps, Ludwig's angina.  I ordered and reviewed the patient's labs which shows a white blood cell count of 14,000 with neutrophilia. BMP includes glucose of 125 without other significant abnormalities.  Labs are otherwise unremarkable.  I ordered, visualized and interpreted CT soft tissue of the neck which shows acute left submandibular sialoadenitis.  Social concerns affecting patient care include the fact that patient lives alone.  Patient's comorbidities include diabetes.  In shared decision-making with the patient she would feel more comfortable with observation overnight as the patient lives alone and has significant swelling to the anterior neck and submandibular region which have worsened significantly over the past 36 hours.  As this could compromise her airway we will admit her, give her IV clindamycin and make sure she has no worsening in the swelling of her neck and submandibular region.  Case discussed with Dr. Adela Glimpse who will bring the patient in for observation   Amount and/or Complexity of Data Reviewed Labs: ordered. Decision-making  details documented in ED Course.  Risk Prescription drug management. Decision regarding hospitalization.           Final Clinical Impression(s) / ED Diagnoses Final diagnoses:  Sialoadenitis of submandibular gland  Facial cellulitis    Rx / DC Orders ED Discharge Orders     None         Arthor Captain, PA-C 07/09/22 2255    Melene Plan, DO 07/10/22 1503

## 2022-07-09 NOTE — ED Triage Notes (Signed)
Pt states sinus headache earlier this week, from Monday-Wed. Pt states teeth pain and "funny feeling" on left side of face after headache went away. PT states sensitivity to liquid/cold on left side. Pt states pain when opening mouth all the way. Pt was evaluated by dentist on Friday and told no issues. Left side of jaw swelling noted down to chin .

## 2022-07-10 DIAGNOSIS — Z7984 Long term (current) use of oral hypoglycemic drugs: Secondary | ICD-10-CM | POA: Diagnosis not present

## 2022-07-10 DIAGNOSIS — L03211 Cellulitis of face: Secondary | ICD-10-CM | POA: Diagnosis not present

## 2022-07-10 DIAGNOSIS — Z1152 Encounter for screening for COVID-19: Secondary | ICD-10-CM | POA: Diagnosis not present

## 2022-07-10 DIAGNOSIS — K1120 Sialoadenitis, unspecified: Secondary | ICD-10-CM | POA: Diagnosis not present

## 2022-07-10 LAB — BASIC METABOLIC PANEL
Anion gap: 8 (ref 5–15)
BUN: 16 mg/dL (ref 8–23)
CO2: 21 mmol/L — ABNORMAL LOW (ref 22–32)
Calcium: 8.9 mg/dL (ref 8.9–10.3)
Chloride: 106 mmol/L (ref 98–111)
Creatinine, Ser: 0.99 mg/dL (ref 0.44–1.00)
GFR, Estimated: >60 mL/min (ref >60)
Glucose, Bld: 119 mg/dL — ABNORMAL HIGH (ref 70–99)
Potassium: 3.7 mmol/L (ref 3.5–5.1)
Sodium: 135 mmol/L (ref 135–145)

## 2022-07-10 LAB — CBC
HCT: 32.8 % — ABNORMAL LOW (ref 36.0–46.0)
Hemoglobin: 11.1 g/dL — ABNORMAL LOW (ref 12.0–15.0)
MCH: 31.7 pg (ref 26.0–34.0)
MCHC: 33.8 g/dL (ref 30.0–36.0)
MCV: 93.7 fL (ref 80.0–100.0)
Platelets: 227 10*3/uL (ref 150–400)
RBC: 3.5 MIL/uL — ABNORMAL LOW (ref 3.87–5.11)
RDW: 13.2 % (ref 11.5–15.5)
WBC: 8.5 10*3/uL (ref 4.0–10.5)
nRBC: 0 % (ref 0.0–0.2)

## 2022-07-10 LAB — HEPATIC FUNCTION PANEL
ALT: 22 U/L (ref 0–44)
AST: 16 U/L (ref 15–41)
Albumin: 3.8 g/dL (ref 3.5–5.0)
Alkaline Phosphatase: 68 U/L (ref 38–126)
Bilirubin, Direct: 0.2 mg/dL (ref 0.0–0.2)
Indirect Bilirubin: 0.9 mg/dL (ref 0.3–0.9)
Total Bilirubin: 1.1 mg/dL (ref 0.3–1.2)
Total Protein: 7.2 g/dL (ref 6.5–8.1)

## 2022-07-10 LAB — CK: Total CK: 57 U/L (ref 38–234)

## 2022-07-10 LAB — TSH: TSH: 1.82 u[IU]/mL (ref 0.350–4.500)

## 2022-07-10 LAB — MAGNESIUM: Magnesium: 1.8 mg/dL (ref 1.7–2.4)

## 2022-07-10 LAB — PHOSPHORUS: Phosphorus: 3.8 mg/dL (ref 2.5–4.6)

## 2022-07-10 LAB — GLUCOSE, CAPILLARY
Glucose-Capillary: 137 mg/dL — ABNORMAL HIGH (ref 70–99)
Glucose-Capillary: 130 mg/dL — ABNORMAL HIGH (ref 70–99)

## 2022-07-10 LAB — PREALBUMIN: Prealbumin: 20 mg/dL (ref 18–38)

## 2022-07-10 MED ORDER — ONDANSETRON HCL 4 MG/2ML IJ SOLN: 4.0000 mg | Freq: Four times a day (QID) | INTRAMUSCULAR | Status: DC | PRN

## 2022-07-10 MED ORDER — SODIUM CHLORIDE 0.9 % IV BOLUS
250.0000 mL | Freq: Once | INTRAVENOUS | Status: AC
  Administered 2022-07-10: 250 mL via INTRAVENOUS

## 2022-07-10 MED ORDER — HYDRALAZINE HCL 20 MG/ML IJ SOLN: 10.0000 mg | INTRAMUSCULAR | Status: DC | PRN

## 2022-07-10 MED ORDER — METOPROLOL TARTRATE 5 MG/5ML IV SOLN: 5.0000 mg | INTRAVENOUS | Status: DC | PRN

## 2022-07-10 MED ORDER — IPRATROPIUM-ALBUTEROL 0.5-2.5 (3) MG/3ML IN SOLN: 3.0000 mL | RESPIRATORY_TRACT | Status: DC | PRN

## 2022-07-10 MED ORDER — CLINDAMYCIN HCL 150 MG PO CAPS: 150.0000 mg | ORAL_CAPSULE | Freq: Three times a day (TID) | ORAL | 0 refills | Status: AC

## 2022-07-10 MED ORDER — ACETAMINOPHEN 325 MG PO TABS: 650.0000 mg | ORAL_TABLET | Freq: Four times a day (QID) | ORAL | Status: DC | PRN

## 2022-07-10 MED ORDER — SENNOSIDES-DOCUSATE SODIUM 8.6-50 MG PO TABS: 1.0000 | ORAL_TABLET | Freq: Every evening | ORAL | Status: DC | PRN

## 2022-07-10 MED ORDER — SODIUM CHLORIDE 0.9 % IV SOLN: INTRAVENOUS | Status: DC

## 2022-07-10 MED ORDER — GUAIFENESIN 100 MG/5ML PO LIQD: 5.0000 mL | ORAL | Status: DC | PRN

## 2022-07-10 MED ORDER — OXYCODONE HCL 5 MG PO TABS: 5.0000 mg | ORAL_TABLET | ORAL | Status: DC | PRN

## 2022-07-10 MED ORDER — TRAZODONE HCL 50 MG PO TABS: 50.0000 mg | ORAL_TABLET | Freq: Every evening | ORAL | Status: DC | PRN

## 2022-07-10 NOTE — Plan of Care (Signed)

## 2022-07-10 NOTE — Discharge Summary (Addendum)
Physician Discharge Summary  Michelle Henderson ZYY:[id number] DOB: 1956/03/14 DOA: 07/09/2022  PCP: Stevphen Rochester, MD  Admit date: 07/09/2022 Discharge date: 07/10/2022  Admitted From: Home Disposition:  Home  Recommendations for Outpatient Follow-up:  Follow up with PCP in 1-2 weeks Please obtain BMP/CBC in one week your next doctors visit.  PO Clindamycin for 6 more days to complete 7 day course. Advised to take PO probiotics after she is done with her Abx course Follow up outpatient dentist in next 1-2 weeks.  Warm compresses, drink plenty of water.   Discharge Condition: Stable CODE STATUS: Full  Diet recommendation: Diabetic  Brief/Interim Summary: 66 year old female with past medical history of HTN, DM2, arthritis, hyperlipidemia comes to the hospital with complaints of 4 days of left-sided facial swelling and funny feeling inside her face.  She saw her dentist about a week ago and everything was normal besides some dental caries.  Since then she developed left-sided facial swelling and tingling sensation inside.  Initially went to an urgent care who recommended she comes to the hospital for further care and management.  In the ED CT of the neck showed acute left-sided submandibular sialoadenitis with regional cellulitis.  No obvious evidence of obstructive stone was noted.  Patient was started on IV clindamycin as she is allergic to penicillin.  The following day she was doing significantly better and tolerating oral without any issues. Patient is requesting to be discharged on oral medication with outpatient follow-up. Briefly discussed case with ENT Dr Daine Gip.    Left-sided facial cellulitis with left submandibular sialoadenitis -Patient is doing significantly well.  No obvious signs of airway compromise.  She is allergic to penicillin.  CT skin reviewed.  Will transition to oral clindamycin with recommendations as stated above.  She will need to follow-up outpatient  dentist. Discussed with ENT, Dr Daine Gip - no indication for steroids for now especially because she is diabetic.   Essential hypertension - Resume home meds  Diabetes mellitus type 2 - Resume home medications   Discharge Diagnoses:  Principal Problem:   Facial cellulitis Active Problems:   Essential hypertension   Controlled type 2 diabetes mellitus without complication, without long-term current use of insulin (HCC)   Dehydration   Tachycardia   Obesity (BMI 30-39.9)      Consultations: None  Subjective: Feeling well no complaints at this time. She would like to go home today with outpatient follow up.   Discharge Exam: Vitals:   07/10/22 0504 07/10/22 0650  BP: 135/78 135/76  Pulse: (!) 116 (!) 110  Resp: (!) 22 18  Temp: 99.3 F (37.4 C) 99 F (37.2 C)  SpO2: 94% 96%   Vitals:   07/10/22 0046 07/10/22 0250 07/10/22 0504 07/10/22 0650  BP: 125/88 (!) 149/83 135/78 135/76  Pulse: (!) 111 (!) 117 (!) 116 (!) 110  Resp: 20 (!) 21 (!) 22 18  Temp: 99.1 F (37.3 C) 99 F (37.2 C) 99.3 F (37.4 C) 99 F (37.2 C)  TempSrc: Oral Oral Oral Oral  SpO2: 98% 94% 94% 96%  Weight:      Height:        General: Pt is alert, awake, not in acute distress Cardiovascular: RRR, S1/S2 +, no rubs, no gallops Respiratory: CTA bilaterally, no wheezing, no rhonchi Abdominal: Soft, NT, ND, bowel sounds + Extremities: no edema, no cyanosis  Discharge Instructions   Allergies as of 07/10/2022       Reactions   Penicillins  Medication List     TAKE these medications    Acidophilus/Goat Milk Caps Take 1 capsule by mouth daily.   albuterol 108 (90 Base) MCG/ACT inhaler Commonly known as: VENTOLIN HFA Inhale 2 puffs into the lungs every 6 (six) hours as needed for shortness of breath or wheezing.   atorvastatin 20 MG tablet Commonly known as: LIPITOR Take 20 mg by mouth every other day.   Calcium 250 MG Caps Take 1 tablet by mouth 3 (three) times  daily.   CENTRUM SILVER 50+WOMEN PO Take 1 tablet by mouth daily.   PRESERVISION AREDS 2 PO Take 2 tablets by mouth daily.   clindamycin 150 MG capsule Commonly known as: CLEOCIN Take 1 capsule (150 mg total) by mouth 3 (three) times daily for 6 days.   fexofenadine 180 MG tablet Commonly known as: ALLEGRA Take 180 mg by mouth daily.   fluticasone 50 MCG/ACT nasal spray Commonly known as: FLONASE Place 2 sprays into both nostrils daily.   glucosamine-chondroitin 500-400 MG tablet Take 2 tablets by mouth daily.   hydrochlorothiazide 25 MG tablet Commonly known as: HYDRODIURIL Take 25 mg by mouth daily.   lisinopril 40 MG tablet Commonly known as: ZESTRIL Take 40 mg by mouth daily.   metFORMIN 500 MG 24 hr tablet Commonly known as: GLUCOPHAGE-XR Take 500 mg by mouth 2 (two) times daily.   Ozempic (1 MG/DOSE) 4 MG/3ML Sopn Generic drug: Semaglutide (1 MG/DOSE) Inject 1 mg into the skin once a week.        Follow-up Information     Stevphen Rochester, MD Follow up in 1 week(s).   Specialty: Family Medicine Contact information: [address] [place] [address] [phone]                Allergies  Allergen Reactions   Penicillins     You were cared for by a hospitalist during your hospital stay. If you have any questions about your discharge medications or the care you received while you were in the hospital after you are discharged, you can call the unit and asked to speak with the hospitalist on call if the hospitalist that took care of you is not available. Once you are discharged, your primary care physician will handle any further medical issues. Please note that no refills for any discharge medications will be authorized once you are discharged, as it is imperative that you return to your primary care physician (or establish a relationship with a primary care physician if you do not have one) for your aftercare needs so that they  can reassess your need for medications and monitor your lab values.   Procedures/Studies: CT Soft Tissue Neck W Contrast  Result Date: 07/09/2022 CLINICAL DATA:  Initial evaluation for soft tissue infection, throat swelling. EXAM: CT NECK WITH CONTRAST TECHNIQUE: Multidetector CT imaging of the neck was performed using the standard protocol following the bolus administration of intravenous contrast. RADIATION DOSE REDUCTION: This exam was performed according to the departmental dose-optimization program which includes automated exposure control, adjustment of the mA and/or kV according to patient size and/or use of iterative reconstruction technique. CONTRAST:  75mL OMNIPAQUE IOHEXOL 300 MG/ML  SOLN COMPARISON:  None Available. FINDINGS: Pharynx and larynx: Oral cavity within normal limits. Asymmetric swelling with mucosal edema noted within the left oropharynx due to the inflammatory process within the adjacent left face/neck. Supraglottic airway remains patent. No retropharyngeal collection. Negative epiglottis. Glottis normal. Subglottic airway patent clear. Salivary glands:  Parotid glands within normal limits. Normal right submandibular gland. Left submandibular gland is asymmetrically enlarged with heterogeneous and irregular appearance, suggestive of acute sialoadenitis. No visible obstructive stone within the left submandibular duct, although evaluation somewhat limited by streak artifact from dental amalgam. No discrete abscess or drainable fluid collection. Associated soft tissue swelling with inflammatory stranding throughout the adjacent left parapharyngeal and submandibular spaces, with extension to involve the submental region. Associated swelling and edema present within the left sublingual space as well. Inflammatory changes extend to partially surround the left mandibular body. There is a prominent dental carie at the left third mandibular molar. Unclear whether this may be contributory to the  underlying infection. Thyroid: 7 mm right thyroid nodule, of doubtful significance given size and patient age, no follow-up imaging recommended (ref: J Am Coll Radiol. 2015 Feb;12(2): 143-50).Thyroid otherwise unremarkable. Lymph nodes: Prominent left upper cervical lymph nodes, largest of which measures 1 cm in short axis at level 1 B, presumably reactive. Vascular: Mild atherosclerosis about the aortic arch and carotid bifurcations. Normal intravascular enhancement seen throughout the neck. Limited intracranial: Unremarkable. Visualized orbits: Unremarkable. Mastoids and visualized paranasal sinuses: Mild scattered mucosal thickening noted about the ethmoidal air cells and right maxillary sinus. Visualized paranasal sinuses are otherwise clear. Trace right mastoid effusion noted, of doubtful significance. Middle ear cavities are clear. Skeleton: No discrete or worrisome osseous lesions. Moderate multilevel cervical spondylosis, most pronounced at C7-T1. Upper chest: Visualized upper chest demonstrates no acute finding. Partially visualized lungs are clear. Other: None. IMPRESSION: 1. Findings consistent with acute left submandibular sialoadenitis with regional cellulitis. No visible obstructive stone. Inflammatory changes extend to partially surround the left mandibular body, with note made of a prominent dental carie about the left third mandibular molar. Unclear whether this is contributory to the underlying infection. Correlation with physical exam recommended. No discrete abscess or drainable fluid collection. 2. Mildly enlarged left upper cervical lymph nodes, presumably reactive. 3. Moderate multilevel cervical spondylosis, most pronounced at C7-T1. Aortic Atherosclerosis (ICD10-I70.0). Electronically Signed   By: Rise Mu M.D.   On: 07/09/2022 19:42     The results of significant diagnostics from this hospitalization (including imaging, microbiology, ancillary and laboratory) are listed below  for reference.     Microbiology: Recent Results (from the past 240 hour(s))  Resp panel by RT-PCR (RSV, Flu A&B, Covid) Anterior Nasal Swab     Status: None   Collection Time: 07/09/22  5:02 PM   Specimen: Anterior Nasal Swab  Result Value Ref Range Status   SARS Coronavirus 2 by RT PCR NEGATIVE NEGATIVE Final    Comment: (NOTE) SARS-CoV-2 target nucleic acids are NOT DETECTED.  The SARS-CoV-2 RNA is generally detectable in upper respiratory specimens during the acute phase of infection. The lowest concentration of SARS-CoV-2 viral copies this assay can detect is 138 copies/mL. A negative result does not preclude SARS-Cov-2 infection and should not be used as the sole basis for treatment or other patient management decisions. A negative result may occur with  improper specimen collection/handling, submission of specimen other than nasopharyngeal swab, presence of viral mutation(s) within the areas targeted by this assay, and inadequate number of viral copies(<138 copies/mL). A negative result must be combined with clinical observations, patient history, and epidemiological information. The expected result is Negative.  Fact Sheet for Patients:  BloggerCourse.com  Fact Sheet for Healthcare Providers:  SeriousBroker.it  This test is no t yet approved or cleared by the [place] FDA and  has been authorized for detection  and/or diagnosis of SARS-CoV-2 by FDA under an Emergency Use Authorization (EUA). This EUA will remain  in effect (meaning this test can be used) for the duration of the COVID-19 declaration under Section 564(b)(1) of the Act, 21 U.S.C.section 360bbb-3(b)(1), unless the authorization is terminated  or revoked sooner.       Influenza A by PCR NEGATIVE NEGATIVE Final   Influenza B by PCR NEGATIVE NEGATIVE Final    Comment: (NOTE) The Xpert Xpress SARS-CoV-2/FLU/RSV plus assay is intended as an aid in the  diagnosis of influenza from Nasopharyngeal swab specimens and should not be used as a sole basis for treatment. Nasal washings and aspirates are unacceptable for Xpert Xpress SARS-CoV-2/FLU/RSV testing.  Fact Sheet for Patients: BloggerCourse.com  Fact Sheet for Healthcare Providers: SeriousBroker.it  This test is not yet approved or cleared by the [place] FDA and has been authorized for detection and/or diagnosis of SARS-CoV-2 by FDA under an Emergency Use Authorization (EUA). This EUA will remain in effect (meaning this test can be used) for the duration of the COVID-19 declaration under Section 564(b)(1) of the Act, 21 U.S.C. section 360bbb-3(b)(1), unless the authorization is terminated or revoked.     Resp Syncytial Virus by PCR NEGATIVE NEGATIVE Final    Comment: (NOTE) Fact Sheet for Patients: BloggerCourse.com  Fact Sheet for Healthcare Providers: SeriousBroker.it  This test is not yet approved or cleared by the [place] FDA and has been authorized for detection and/or diagnosis of SARS-CoV-2 by FDA under an Emergency Use Authorization (EUA). This EUA will remain in effect (meaning this test can be used) for the duration of the COVID-19 declaration under Section 564(b)(1) of the Act, 21 U.S.C. section 360bbb-3(b)(1), unless the authorization is terminated or revoked.  Performed at [facility], [address], [place], [place] [address]      Labs: BNP (last 3 results) No results for input(s): "BNP" in the last 8760 hours. Basic Metabolic Panel: Recent Labs  Lab 07/09/22 1705 07/09/22 2258  NA 135  --   K 3.9  --   CL 102  --   CO2 22  --   GLUCOSE 125*  --   BUN 23  --   CREATININE 1.06*  --   CALCIUM 10.0  --   MG  --  1.8  PHOS  --  3.8   Liver Function Tests: Recent Labs  Lab 07/09/22 2258  AST 16  ALT 22  ALKPHOS  68  BILITOT 1.1  PROT 7.2  ALBUMIN 3.8   No results for input(s): "LIPASE", "AMYLASE" in the last 168 hours. No results for input(s): "AMMONIA" in the last 168 hours. CBC: Recent Labs  Lab 07/09/22 1705  WBC 14.0*  NEUTROABS 10.5*  HGB 13.1  HCT 38.3  MCV 92.7  PLT 285   Cardiac Enzymes: Recent Labs  Lab 07/09/22 2258  CKTOTAL 57   BNP: Invalid input(s): "POCBNP" CBG: Recent Labs  Lab 07/09/22 2330 07/10/22 0347 07/10/22 0744  GLUCAP 140* 137* 130*   D-Dimer No results for input(s): "DDIMER" in the last 72 hours. Hgb A1c No results for input(s): "HGBA1C" in the last 72 hours. Lipid Profile No results for input(s): "CHOL", "HDL", "LDLCALC", "TRIG", "CHOLHDL", "LDLDIRECT" in the last 72 hours. Thyroid function studies Recent Labs    07/09/22 2258  TSH 1.820   Anemia work up No results for input(s): "VITAMINB12", "FOLATE", "FERRITIN", "TIBC", "IRON", "RETICCTPCT" in the last 72 hours. Urinalysis No results found for: "COLORURINE", "APPEARANCEUR", "LABSPEC", "PHURINE", "  GLUCOSEU", "HGBUR", "BILIRUBINUR", "KETONESUR", "PROTEINUR", "UROBILINOGEN", "NITRITE", "LEUKOCYTESUR" Sepsis Labs Recent Labs  Lab 07/09/22 1705  WBC 14.0*   Microbiology Recent Results (from the past 240 hour(s))  Resp panel by RT-PCR (RSV, Flu A&B, Covid) Anterior Nasal Swab     Status: None   Collection Time: 07/09/22  5:02 PM   Specimen: Anterior Nasal Swab  Result Value Ref Range Status   SARS Coronavirus 2 by RT PCR NEGATIVE NEGATIVE Final    Comment: (NOTE) SARS-CoV-2 target nucleic acids are NOT DETECTED.  The SARS-CoV-2 RNA is generally detectable in upper respiratory specimens during the acute phase of infection. The lowest concentration of SARS-CoV-2 viral copies this assay can detect is 138 copies/mL. A negative result does not preclude SARS-Cov-2 infection and should not be used as the sole basis for treatment or other patient management decisions. A negative result may  occur with  improper specimen collection/handling, submission of specimen other than nasopharyngeal swab, presence of viral mutation(s) within the areas targeted by this assay, and inadequate number of viral copies(<138 copies/mL). A negative result must be combined with clinical observations, patient history, and epidemiological information. The expected result is Negative.  Fact Sheet for Patients:  BloggerCourse.comhttps://www.fda.gov/media/152166/download  Fact Sheet for Healthcare Providers:  SeriousBroker.ithttps://www.fda.gov/media/152162/download  This test is no t yet approved or cleared by the [place] FDA and  has been authorized for detection and/or diagnosis of SARS-CoV-2 by FDA under an Emergency Use Authorization (EUA). This EUA will remain  in effect (meaning this test can be used) for the duration of the COVID-19 declaration under Section 564(b)(1) of the Act, 21 U.S.C.section 360bbb-3(b)(1), unless the authorization is terminated  or revoked sooner.       Influenza A by PCR NEGATIVE NEGATIVE Final   Influenza B by PCR NEGATIVE NEGATIVE Final    Comment: (NOTE) The Xpert Xpress SARS-CoV-2/FLU/RSV plus assay is intended as an aid in the diagnosis of influenza from Nasopharyngeal swab specimens and should not be used as a sole basis for treatment. Nasal washings and aspirates are unacceptable for Xpert Xpress SARS-CoV-2/FLU/RSV testing.  Fact Sheet for Patients: BloggerCourse.comhttps://www.fda.gov/media/152166/download  Fact Sheet for Healthcare Providers: SeriousBroker.ithttps://www.fda.gov/media/152162/download  This test is not yet approved or cleared by the [place] FDA and has been authorized for detection and/or diagnosis of SARS-CoV-2 by FDA under an Emergency Use Authorization (EUA). This EUA will remain in effect (meaning this test can be used) for the duration of the COVID-19 declaration under Section 564(b)(1) of the Act, 21 U.S.C. section 360bbb-3(b)(1), unless the authorization is terminated  or revoked.     Resp Syncytial Virus by PCR NEGATIVE NEGATIVE Final    Comment: (NOTE) Fact Sheet for Patients: BloggerCourse.comhttps://www.fda.gov/media/152166/download  Fact Sheet for Healthcare Providers: SeriousBroker.ithttps://www.fda.gov/media/152162/download  This test is not yet approved or cleared by the [place] FDA and has been authorized for detection and/or diagnosis of SARS-CoV-2 by FDA under an Emergency Use Authorization (EUA). This EUA will remain in effect (meaning this test can be used) for the duration of the COVID-19 declaration under Section 564(b)(1) of the Act, 21 U.S.C. section 360bbb-3(b)(1), unless the authorization is terminated or revoked.  Performed at [facility], [address] [place], [place] [id number]      Time coordinating discharge:  I have spent 35 minutes face to face with the patient and on the ward discussing the patients care, assessment, plan and disposition with other care givers. >50% of the time was devoted counseling the patient about the risks and benefits of  treatment/Discharge disposition and coordinating care.   SIGNED:   Dimple Nanas, MD  [facility] 07/10/2022, 9:28 AM   If 7PM-7AM, please contact night-coverage

## 2022-07-10 NOTE — Progress Notes (Signed)
   07/10/22 0046  Assess: MEWS Score  Temp 99.1 F (37.3 C)  BP 125/88  MAP (mmHg) 98  Pulse Rate (!) 111  Resp 20  SpO2 98 %  O2 Device Room Air  Assess: MEWS Score  MEWS Temp 0  MEWS Systolic 0  MEWS Pulse 2  MEWS RR 0  MEWS LOC 0  MEWS Score 2  MEWS Score Color Yellow  Assess: if the MEWS score is Yellow or Red  Were vital signs taken at a resting state? Yes  Focused Assessment No change from prior assessment  Does the patient meet 2 or more of the SIRS criteria? No  MEWS guidelines implemented *See Row Information* Yes  Treat  MEWS Interventions Other (Comment) (yellow Mews at ER too and no PRN med)  Pain Scale 0-10  Pain Score 4  Pain Type Acute pain  Pain Location Face  Pain Orientation Left  Pain Descriptors / Indicators Aching  Pain Frequency Constant  Pain Onset On-going  Pain Intervention(s) Rest  Take Vital Signs  Increase Vital Sign Frequency  Yellow: Q 2hr X 2 then Q 4hr X 2, if remains yellow, continue Q 4hrs  Escalate  MEWS: Escalate Yellow: discuss with charge nurse/RN and consider discussing with provider and RRT  Notify: Charge Nurse/RN  Name of Charge Nurse/RN Notified [place], RN  Date Charge Nurse/RN Notified 07/10/22  Time Charge Nurse/RN Notified 0300  Provider Notification  Provider Name/Title Chinita [place]land, NP  Date Provider Notified 07/10/22  Time Provider Notified 0300  Method of Notification Page (and secure chat)  Notification Reason Other (Comment) (yellow MEWS)  Provider response No new orders  Date of Provider Response 07/10/22  Time of Provider Response 0306  Assess: SIRS CRITERIA  SIRS Temperature  0  SIRS Pulse 1  SIRS Respirations  0  SIRS WBC 1  SIRS Score Sum  2    New pt from ER was in Yellow MEWS d/t elevated HR and is still in Yellow MEWS. Pt stated pt's usual HRs were  higher than normal range. Will continue to monitor.

## 2022-07-10 NOTE — TOC Initial Note (Signed)
Transition of Care [facility]) - Initial/Assessment Note    Patient Details  Name: Michelle Henderson MRN: [id number] Date of Birth: June 03, 1956  Transition of Care [facility]) CM/SW Contact:    Golda Acre, RN Phone Number: 07/10/2022, 9:05 AM  Clinical Narrative:                  Transition of Care [facility]) Screening Note   Patient Details  Name: Michelle Henderson Date of Birth: 08-07-1955   Transition of Care [facility]) CM/SW Contact:    Golda Acre, RN Phone Number: 07/10/2022, 9:05 AM    Transition of Care Department [facility]) has reviewed patient and no TOC needs have been identified at this time. We will continue to monitor patient advancement through interdisciplinary progression rounds. If new patient transition needs arise, please place a TOC consult.    Expected Discharge Plan: Home/Self Care Barriers to Discharge: Continued Medical Work up   Patient Goals and CMS Choice Patient states their goals for this hospitalization and ongoing recovery are:: to go home CMS Medicare.gov Compare Post Acute Care list provided to:: Patient        Expected Discharge Plan and Services   Discharge Planning Services: CM Consult   Living arrangements for the past 2 months: Single Family Home                                      Prior Living Arrangements/Services Living arrangements for the past 2 months: Single Family Home Lives with:: Self Patient language and need for interpreter reviewed:: Yes Do you feel safe going back to the place where you live?: Yes            Criminal Activity/Legal Involvement Pertinent to Current Situation/Hospitalization: No - Comment as needed  Activities of Daily Living Home Assistive Devices/Equipment: None ADL Screening (condition at time of admission) Patient's cognitive ability adequate to safely complete daily activities?: Yes Is the patient deaf or have difficulty hearing?: No Does the patient have difficulty seeing,  even when wearing glasses/contacts?: No Does the patient have difficulty concentrating, remembering, or making decisions?: No Patient able to express need for assistance with ADLs?: Yes Does the patient have difficulty dressing or bathing?: No Independently performs ADLs?: Yes (appropriate for developmental age) Does the patient have difficulty walking or climbing stairs?: Yes Weakness of Legs: Both Weakness of Arms/Hands: None  Permission Sought/Granted                  Emotional Assessment Appearance:: Appears stated age Attitude/Demeanor/Rapport: Engaged Affect (typically observed): Calm Orientation: : Oriented to Self, Oriented to Place, Oriented to  Time, Oriented to Situation Alcohol / Substance Use: Alcohol Use (occasonial intake) Psych Involvement: No (comment)  Admission diagnosis:  Facial cellulitis [L03.211] Patient Active Problem List   Diagnosis Date Noted   Facial cellulitis 07/09/2022   Essential hypertension 07/09/2022   Controlled type 2 diabetes mellitus without complication, without long-term current use of insulin (HCC) 07/09/2022   Dehydration 07/09/2022   Tachycardia 07/09/2022   Obesity (BMI 30-39.9) 07/09/2022   PCP:  Stevphen Rochester, MD Pharmacy:   [phone] - [place], [place] - [address] AT [facility] [address] [place] [address] Phone: [phone] Fax: [phone]     Social Determinants of Health (SDOH) Social History: SDOH Screenings   Food Insecurity: No Food Insecurity (07/09/2022)  Housing: Low Risk  (  07/09/2022)  Transportation Needs: No Transportation Needs (07/09/2022)  Utilities: Not At Risk (07/09/2022)  Tobacco Use: Low Risk  (07/09/2022)   SDOH Interventions:     Readmission Risk Interventions   No data to display

## 2022-07-10 NOTE — TOC Transition Note (Signed)
Transition of Care [facility]) - CM/SW Discharge Note   Patient Details  Name: Michelle Henderson MRN: [id number] Date of Birth: [birth year]  Transition of Care [facility]) CM/SW Contact:  Golda Acre, RN Phone Number: 07/10/2022, 9:40 AM   Clinical Narrative:     [id number]/patient discharged to return home.  Chart reviewed for TOC needs.  None found.  Patient self care.    Barriers to Discharge: Continued Medical Work up   Patient Goals and CMS Choice CMS Medicare.gov Compare Post Acute Care list provided to:: Patient    Discharge Placement                         Discharge Plan and Services Additional resources added to the After Visit Summary for     Discharge Planning Services: CM Consult                                 Social Determinants of Health (SDOH) Interventions SDOH Screenings   Food Insecurity: No Food Insecurity (07/09/2022)  Housing: Low Risk  (07/09/2022)  Transportation Needs: No Transportation Needs (07/09/2022)  Utilities: Not At Risk (07/09/2022)  Tobacco Use: Low Risk  (07/09/2022)     Readmission Risk Interventions   No data to display

## 2022-07-10 NOTE — Progress Notes (Signed)
Patient given discharge, follow up, and medication instructions, verbalized understanding, IV and telemetry monitor removed, personal belongings with patient, will transport self home

## 2022-07-10 NOTE — Discharge Instructions (Signed)
Massage of the gland: May express pus and relieve some of the pressure  Hydration with intravenous fluid or aggressive oral hydration along with electrolyte repletion  Warm compresses  Oral cavity irrigations  Administration of sialagogues such as lemon drops or vitamin C lozenges

## 2022-07-11 LAB — HEMOGLOBIN A1C
Hgb A1c MFr Bld: 6 % — ABNORMAL HIGH (ref 4.8–5.6)
Mean Plasma Glucose: 126 mg/dL

## 2023-04-17 IMAGING — MG MM DIGITAL SCREENING BILAT W/ TOMO AND CAD
8 series · 8 of 24 positions shown · non-contrast
Comparison: Previous exam(s).

CLINICAL DATA: Screening.

EXAM:
DIGITAL SCREENING BILATERAL MAMMOGRAM WITH TOMOSYNTHESIS AND CAD
TECHNIQUE: Bilateral screening digital craniocaudal and mediolateral oblique
mammograms were obtained. Bilateral screening digital breast
tomosynthesis was performed. The images were evaluated with
computer-aided detection.

[L CC synth-2D]
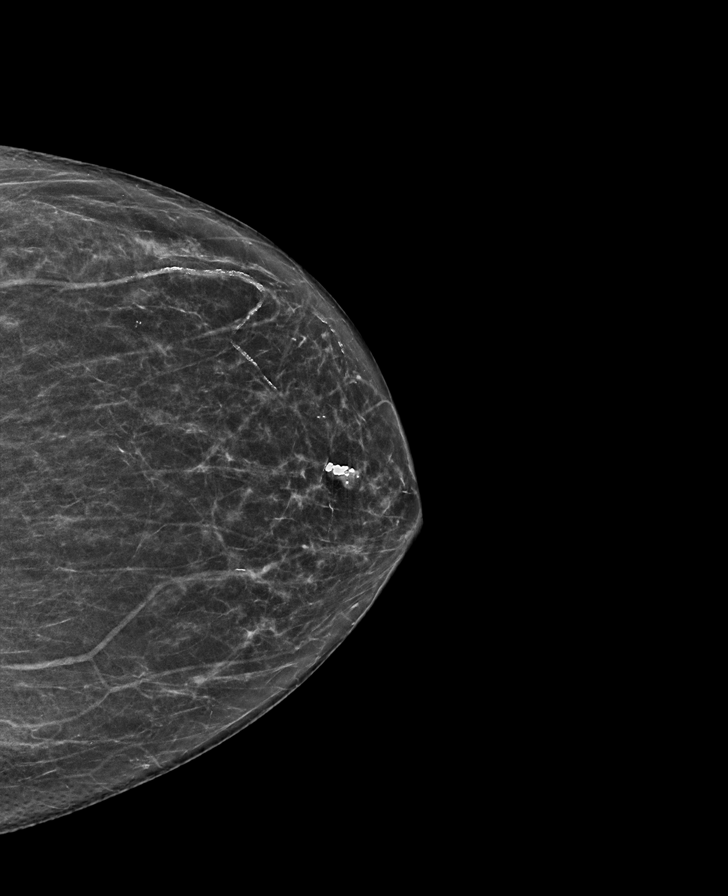

[R CC synth-2D]
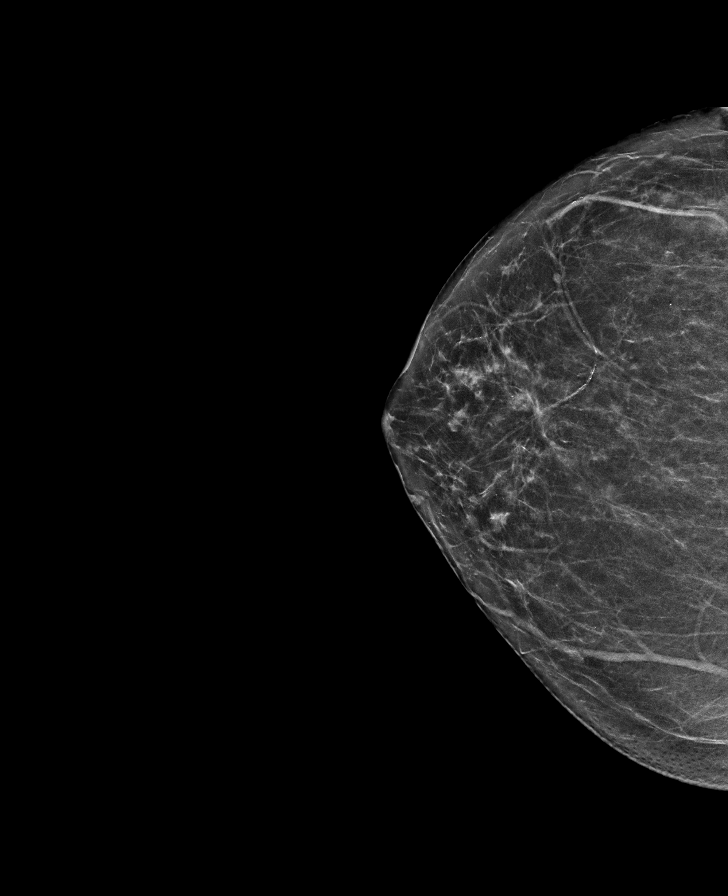

[L MLO synth-2D]
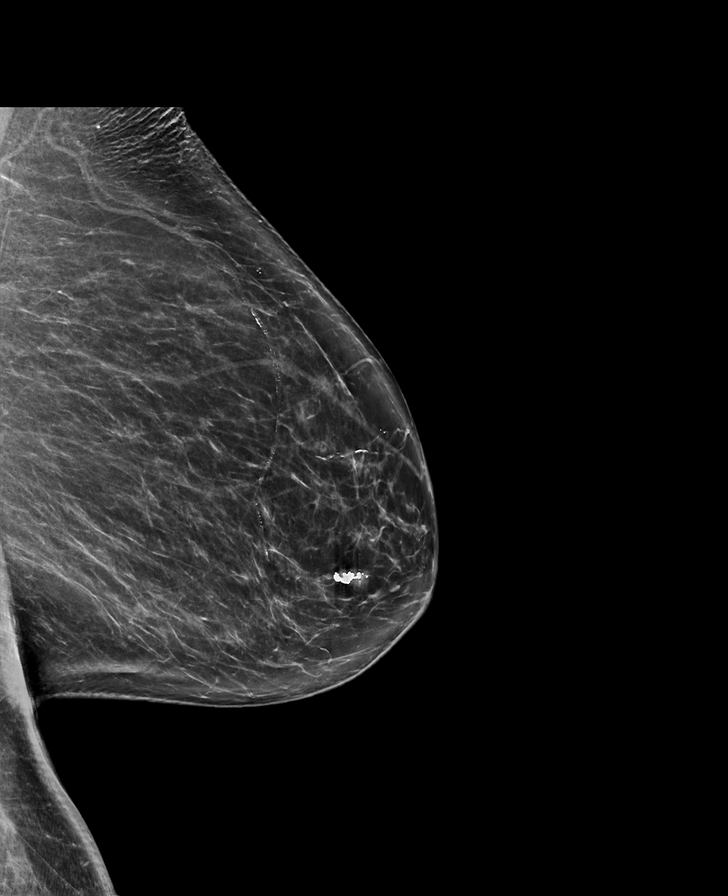

[R MLO synth-2D]
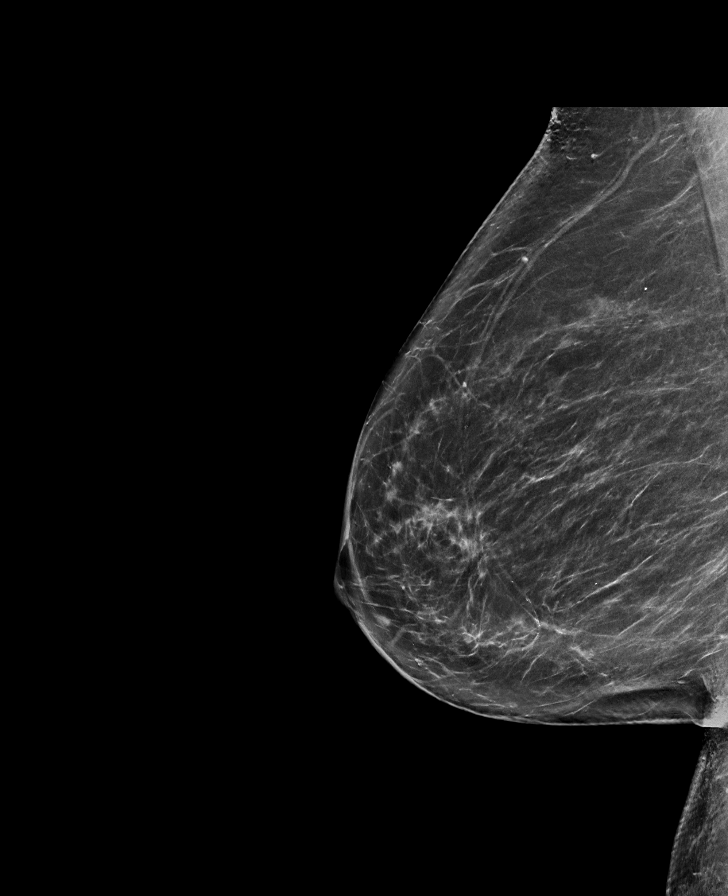

[R CC tomo · tomo slice 33/65.0]
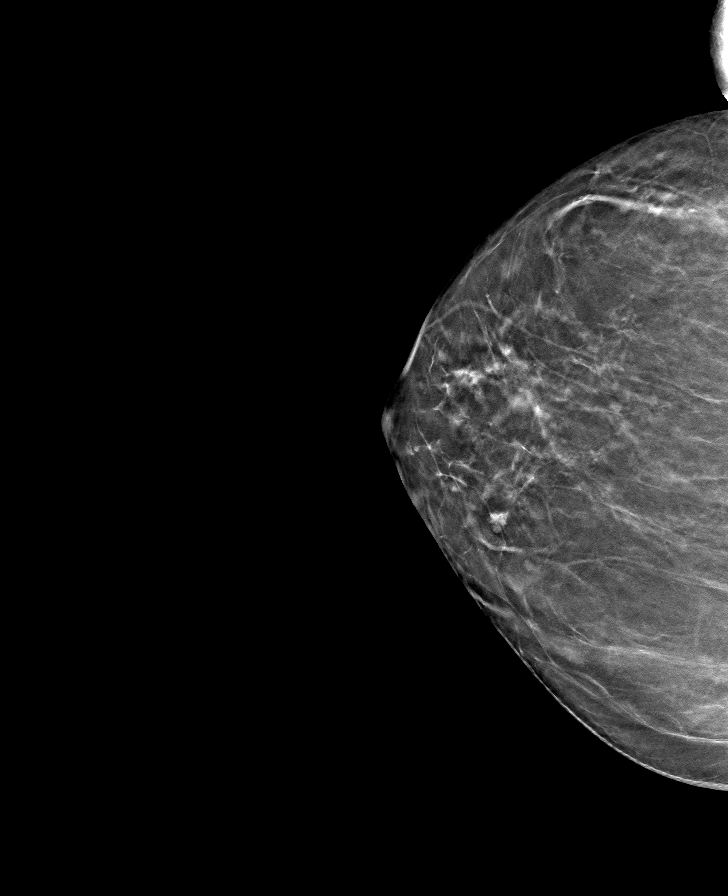

[L MLO tomo · tomo slice 41/80.0]
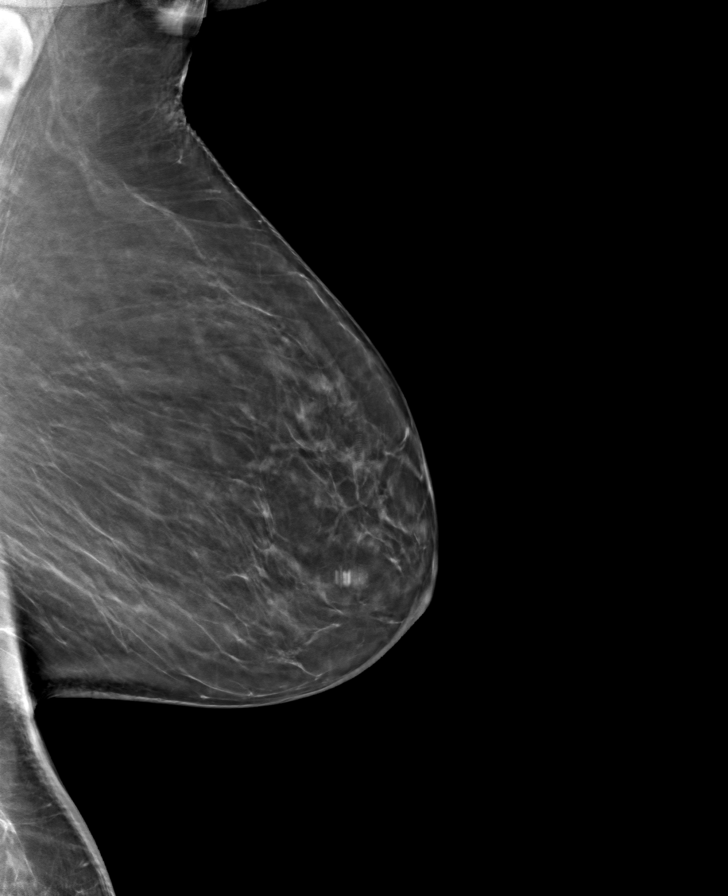

[R MLO tomo · tomo slice 39/78.0]
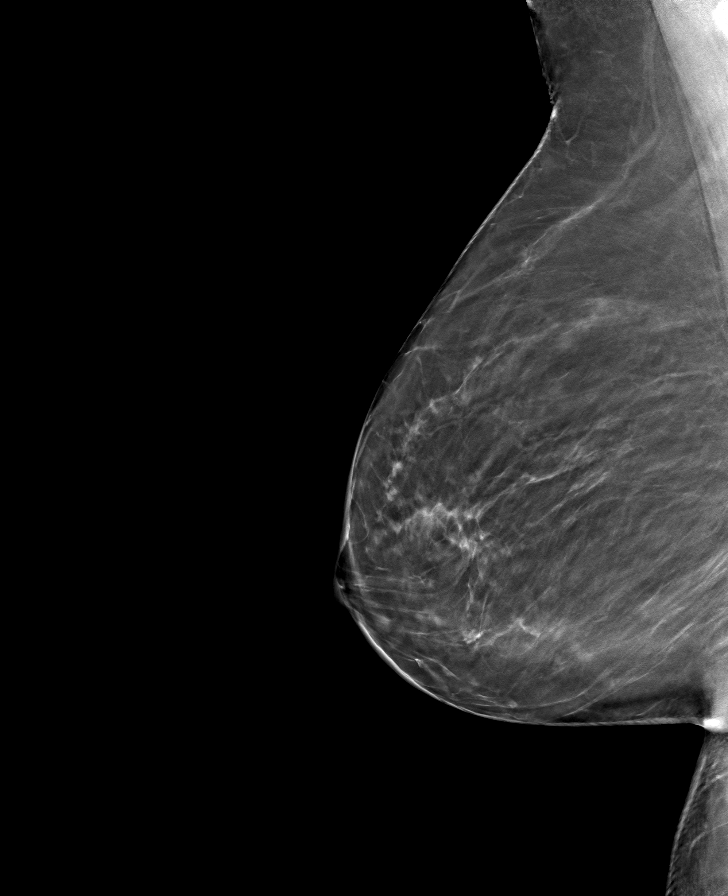

[L CC tomo · tomo slice 31/60.0]
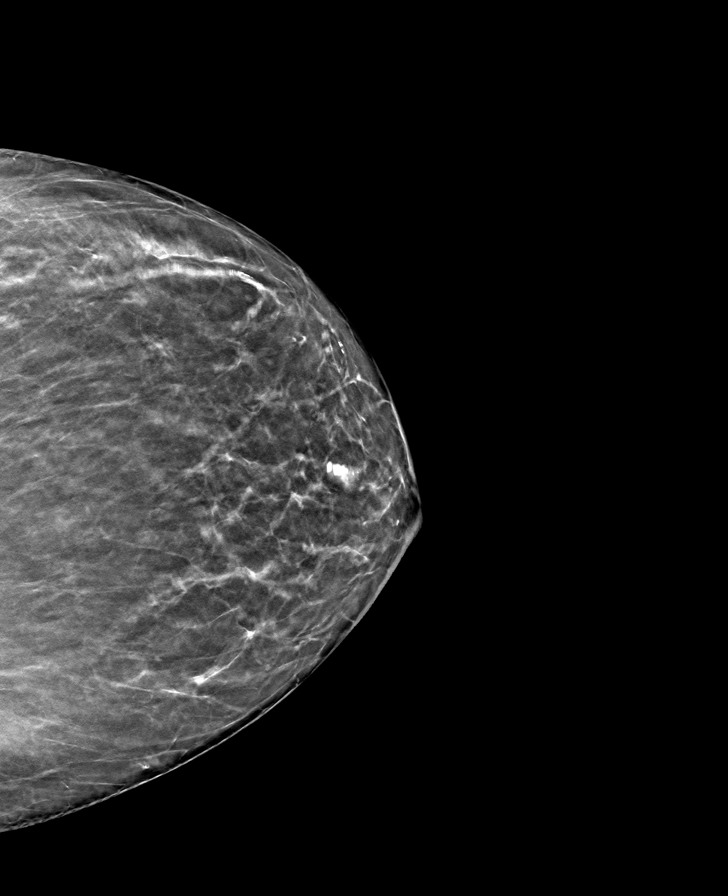

[8 of 24 positions shown; findings below may reference images not displayed]

ACR Breast Density Category b: There are scattered areas of
fibroglandular density.
FINDINGS: There are no findings suspicious for malignancy.
IMPRESSION: No mammographic evidence of malignancy. A result letter of this
screening mammogram will be mailed directly to the patient.

RECOMMENDATION:
Screening mammogram in one year. (Code:51-O-LD2)

BI-RADS CATEGORY  1: Negative.

## 2023-04-27 ENCOUNTER — Other Ambulatory Visit: Payer: Self-pay | Admitting: Family Medicine

## 2023-04-27 DIAGNOSIS — Z1231 Encounter for screening mammogram for malignant neoplasm of breast: Secondary | ICD-10-CM

## 2023-06-04 ENCOUNTER — Ambulatory Visit: Payer: Medicare PPO

## 2023-06-22 ENCOUNTER — Ambulatory Visit
Admission: RE | Admit: 2023-06-22 | Discharge: 2023-06-22 | Disposition: A | Discharge status: Home / Self Care | Payer: Medicare PPO | Source: Ambulatory Visit | Attending: Family Medicine | Admitting: Family Medicine

## 2023-06-22 DIAGNOSIS — Z1231 Encounter for screening mammogram for malignant neoplasm of breast: Secondary | ICD-10-CM

## 2023-06-25 NOTE — Progress Notes (Signed)
°  [facility] RETURN PATIENT EVALUATION   Primary Care Physician:  Erminio LITTIE Sensing, MD   Patient ID:  Michelle Henderson is a 67 y.o. (DOB 04/08/1956) female.    Subjective   Patient ID:  Michelle Henderson is a 67 y.o. (DOB 04-11-1956) female.     Patient presents with   Follow-up    Discuss sleep study     Michelle Henderson is here for a follow up after sleep study.   Patient completed home sleep study on 05/15/23 which shows mild obstructive sleep apnea based on a respiratory disturbance index of 8.1 and oxygen nadir of 69 %. She returns to the clinic to follow up on these findings and discuss treatment options. She has lost 4lbs since her original appointment and reports continued efforts to lose weight and be more active.   Her Epworth sleepiness score is 6 out of 24 with scores of 7 or less being considered normal.   She denies any issues with driving drowsy but does understand the dangers and knows to pull over.  Past Medical History, Past Surgery History, Allergies, Social History, and Family History were reviewed and updated.    Review of Systems is complete and negative except as noted.  Objective   BP 118/66   Pulse 109   Temp 97.8 F (36.6 C) (Skin)   Resp 16   Ht 5' 2.5 (1.588 m)   Wt 217 lb (98.4 kg)   SpO2 98%   BMI 39.06 kg/m  General:  Well Developed, Well Nourished, No distress, Well-groomed and pleasant. HEENT:     Mallampati 3. She has an elevated hard palate without some scalloping noted bilateral to the tongue Neck:  Neck circumference is 17.5 inches. CV:   RRR without Murmur  Lungs:   clear to auscultation with normal effort Neuro:  alert, oriented, and ambulating without difficulty.  Memory intact.  Assessment   67 y.o. female with PMHx as above who presents for follow up after sleep study. Discussed findings of home sleep study in detail which show mild obstructive sleep apnea. Treatment  options include CPAP due to other chronic health conditions, weight loss or dental device. Patient would like to try dental device at this time. She will reach out to dentist for this. Follow up in 6 months to assess use of dental device and symptom management.  Plan     Mild Obstructive Sleep Apnea Discussed CPAP vs Dental device Recommend patient reach out to dentist for this Follow up in 6 months.   Risks, benefits, and alternatives of the medications and treatment plan prescribed today were discussed, and patient expressed understanding. Plan follow-up as discussed or as needed if any worsening symptoms or change in condition.

## 2023-06-27 ENCOUNTER — Other Ambulatory Visit: Payer: Self-pay | Admitting: Family Medicine

## 2023-06-27 DIAGNOSIS — R928 Other abnormal and inconclusive findings on diagnostic imaging of breast: Secondary | ICD-10-CM

## 2023-07-26 ENCOUNTER — Ambulatory Visit
Admission: RE | Admit: 2023-07-26 | Discharge: 2023-07-26 | Disposition: A | Discharge status: Home / Self Care | Payer: Medicare PPO | Source: Ambulatory Visit | Attending: Family Medicine | Admitting: Family Medicine

## 2023-07-26 DIAGNOSIS — R928 Other abnormal and inconclusive findings on diagnostic imaging of breast: Secondary | ICD-10-CM

## 2023-12-04 NOTE — Progress Notes (Signed)
 Patient and/or patient's guardian was notified and verbally consented to the use of our virtual scribe service to record the providers conversation with the patient during this encounter.  Patient ID:  Michelle Henderson is a 68 y.o. (DOB 07-23-1955) female.    Assessment and Plan  1. Hypertension associated with diabetes (*) (Primary) 2. Hyperlipidemia associated with type 2 diabetes mellitus (*) -     Lipid Panel; Future 3. Class 2 severe obesity due to excess calories with serious comorbidity and body mass index (BMI) of 39.0 to 39.9 in adult (*) 4. Type 2 diabetes mellitus with other specified complication, without long-term current use of insulin  (*) -     Hemoglobin A1c; Future 5. Elevated LFTs -     Comprehensive Metabolic Panel; Future 6. CKD stage 3 due to type 2 diabetes mellitus (*) -     Albumin/Creatinine Ratio, Random Urine; Future 7. Hypercalcemia -     PTH Intact; Future    Assessment & Plan Diabetes Mellitus: - Blood glucose levels well-controlled, but weight gain of 6 pounds since last visit in 07/2023 - Advised to maintain a healthy diet, avoid eating after 7 PM, and ensure snacks are low in carbohydrates and high in protein - Comprehensive blood workup to assess kidney function, blood glucose levels, and cholesterol profile - Urine test to be performed today to check for proteinuria - Encouraged to engage in meal preparation over weekends to avoid unhealthy food choices  Hypertension: - Blood pressure readings consistently within normal range - Advised to continue monitoring blood pressure at home and maintain current medication regimen  Hyperlipidemia: - Cholesterol levels well-managed - Advised to continue current medication regimen and maintain a healthy diet  Elevated Calcium  Levels: - Calcium  levels persistently elevated - Parathyroid level test to be ordered - If results indicate high calcium  levels, advised to switch to a multivitamin without  calcium   Knee Pain: - Reports ongoing bilateral knee pain, no recent x-rays - Weight loss recommended to help alleviate knee pain She is not willing to get knee xrays at this time.  - Advised to schedule physical therapy to strengthen knees and improve mobility  Skin Lesions: - Reports improvement in seborrheic keratoses skin lesions but some remain - Advised to schedule another treatment session at her convenience  Follow-up: - Patient to follow up in 4 months   Follow up in about 4 months (around 04/08/2024) for prediabetes, blood pressure, cholesterol, weight, 30 min.      Risks, benefits, and alternatives of the medications and treatment plan prescribed today were discussed, and patient expressed understanding. Plan follow-up as discussed or as needed if any worsening symptoms or change in condition. Treatment goals and patient self-management goals discussed. Patient voiced understanding of treatment outline and agrees to attempt to comply.  Subjective   Patient ID:  Michelle Henderson is a 68 y.o. (DOB 20-Sep-1955) female    Patient presents with   Diabetes   Hyperlipidemia   Hypertension     HPI    DM, HTN and cholesterol Checking blood sugar: not checking Checking blood pressure: good today and at home.  Eating low sugar, low salt and low fat: has gained weight, drinking 64 oz of water, has been eating extra recently, has been gaining weight. Eating out more recently and more desserts recently.  Has yogurt and cheese, not every day.  Exercise: 3-4 times a week for 30 min.   New symptoms: Never went to PT due to spending 1 month in  [place] for family emergency, back in town for the past month.  History of Present Illness The patient is a 68 year old female being seen today for follow-up on diabetes, hypertension, and hyperlipidemia.  She does not monitor her blood glucose levels at home but maintains a daily hydration of at least 64 ounces and engages in physical  activity 3 to 4 times a week, each session lasting a minimum of 30 minutes. She acknowledges an increase in her food intake, which she believes is contributing to her weight gain. She has been eating more chocolate recently and gained weight since January, but not since her last visit in April.   Her diet includes pasta during the winter months and occasional dairy products such as yogurt and cheese, although she has reduced her overall dairy consumption. She does not consume calcium  supplements apart from those in her multivitamin. She has been eating less chips and crackers.   She reports no new aches or pains and has not undergone knee x-rays recently, expressing a fear of potential knee instability. She has limited stairs at home, which she identifies as a potential issue.  She had scheduled physical therapy appointments but was unable to attend due to a family emergency in [place] , where she spent approximately a month. Upon her return, she did not reschedule these appointments.  She monitors her blood pressure at home, which remains consistent with the readings obtained during her clinic visits.  She has been experiencing pruritus and discomfort due to the presence of numerous skin lesions on her legs. She had a second spot removal procedure performed on her back.    Reviewed and updated this visit by provider: Tobacco  Allergies  Meds  Problems  Med Hx  Surg Hx  Fam Hx        ROS per HPI and below Review of Systems   Objective   Vitals:   12/07/23 0914  BP: 117/65  Patient Position: Sitting  Pulse: 104  Temp: 97.4 F (36.3 C)  TempSrc: Temporal  Height: 5' 2.5 (1.588 m)  Weight: 222 lb 12.8 oz (101.1 kg)  SpO2: 99%  BMI (Calculated): 40.1  PainSc: 0-No pain    Wt Readings from Last 3 Encounters:  12/07/23 222 lb 12.8 oz (101.1 kg)  11/07/23 222 lb 12.8 oz (101.1 kg)  10/08/23 217 lb 12.8 oz (98.8 kg)   BP Readings from Last 3 Encounters:  12/07/23 117/65   11/07/23 118/72  10/08/23 (!) 119/59   Pulse Readings from Last 3 Encounters:  12/07/23 104  11/07/23 108  10/08/23 111    Physical Exam Vitals and nursing note reviewed.  Constitutional:      General: She is not in acute distress.    Appearance: She is obese. She is not diaphoretic.  HENT:     Head: Normocephalic.     Right Ear: Tympanic membrane normal.     Left Ear: Tympanic membrane normal.     Nose: No congestion.     Mouth/Throat:     Mouth: Mucous membranes are moist.     Pharynx: Oropharynx is clear.  Eyes:     General: Vision grossly intact.     Extraocular Movements: Extraocular movements intact.     Conjunctiva/sclera: Conjunctivae normal.     Pupils: Pupils are equal, round, and reactive to light.  Neck:     Vascular: No carotid bruit.  Cardiovascular:     Rate and Rhythm: Normal rate and regular rhythm.     Pulses: Normal pulses.  Heart sounds: Normal heart sounds. No murmur heard. Musculoskeletal:        General: No swelling or tenderness.     Cervical back: Normal range of motion. No muscular tenderness.     Right knee: Crepitus present.     Left knee: Crepitus present.     Right lower leg: No edema.     Left lower leg: No edema.  Pulmonary:     Effort: Pulmonary effort is normal.     Breath sounds: Normal breath sounds. No wheezing.  Abdominal:     General: Abdomen is flat. Bowel sounds are normal.     Palpations: Abdomen is soft.  Lymphadenopathy:     Cervical: No cervical adenopathy.  Skin:    Capillary Refill: Capillary refill takes less than 2 seconds.     Findings: Lesion (multiple SK lesions) present. No rash.  Neurological:     Mental Status: She is alert.     Gait: Gait abnormal.  Psychiatric:        Mood and Affect: Mood normal.        Behavior: Behavior normal.       Physical Exam     Computer technology was used to create visit note. Consent from the patient/care giver was obtained prior to its use.

## 2024-07-01 ENCOUNTER — Inpatient Hospital Stay (HOSPITAL_COMMUNITY)

## 2024-07-01 ENCOUNTER — Other Ambulatory Visit: Payer: Self-pay

## 2024-07-01 ENCOUNTER — Encounter (HOSPITAL_COMMUNITY): Payer: Self-pay | Admitting: Internal Medicine

## 2024-07-01 ENCOUNTER — Emergency Department (HOSPITAL_COMMUNITY)

## 2024-07-01 ENCOUNTER — Inpatient Hospital Stay (HOSPITAL_COMMUNITY)
Admission: EM | Admit: 2024-07-01 | Discharge: 2024-07-07 | DRG: 637 | Disposition: A | Discharge status: Skilled Nursing Facility | Attending: Internal Medicine | Admitting: Internal Medicine

## 2024-07-01 DIAGNOSIS — G9340 Encephalopathy, unspecified: Secondary | ICD-10-CM | POA: Insufficient documentation

## 2024-07-01 DIAGNOSIS — E1110 Type 2 diabetes mellitus with ketoacidosis without coma: Secondary | ICD-10-CM

## 2024-07-01 DIAGNOSIS — E870 Hyperosmolality and hypernatremia: Secondary | ICD-10-CM | POA: Diagnosis present

## 2024-07-01 DIAGNOSIS — G9341 Metabolic encephalopathy: Secondary | ICD-10-CM | POA: Diagnosis present

## 2024-07-01 DIAGNOSIS — N179 Acute kidney failure, unspecified: Secondary | ICD-10-CM | POA: Insufficient documentation

## 2024-07-01 DIAGNOSIS — E1165 Type 2 diabetes mellitus with hyperglycemia: Secondary | ICD-10-CM | POA: Diagnosis present

## 2024-07-01 DIAGNOSIS — Z79899 Other long term (current) drug therapy: Secondary | ICD-10-CM | POA: Diagnosis not present

## 2024-07-01 DIAGNOSIS — R569 Unspecified convulsions: Secondary | ICD-10-CM

## 2024-07-01 DIAGNOSIS — D751 Secondary polycythemia: Secondary | ICD-10-CM | POA: Diagnosis present

## 2024-07-01 DIAGNOSIS — R4701 Aphasia: Secondary | ICD-10-CM | POA: Diagnosis present

## 2024-07-01 DIAGNOSIS — E860 Dehydration: Secondary | ICD-10-CM | POA: Diagnosis present

## 2024-07-01 DIAGNOSIS — E1111 Type 2 diabetes mellitus with ketoacidosis with coma: Secondary | ICD-10-CM | POA: Diagnosis not present

## 2024-07-01 DIAGNOSIS — Z7984 Long term (current) use of oral hypoglycemic drugs: Secondary | ICD-10-CM

## 2024-07-01 DIAGNOSIS — N390 Urinary tract infection, site not specified: Secondary | ICD-10-CM | POA: Diagnosis present

## 2024-07-01 DIAGNOSIS — Z6839 Body mass index (BMI) 39.0-39.9, adult: Secondary | ICD-10-CM

## 2024-07-01 DIAGNOSIS — R531 Weakness: Secondary | ICD-10-CM | POA: Diagnosis present

## 2024-07-01 DIAGNOSIS — E785 Hyperlipidemia, unspecified: Secondary | ICD-10-CM | POA: Insufficient documentation

## 2024-07-01 DIAGNOSIS — E66812 Obesity, class 2: Secondary | ICD-10-CM | POA: Diagnosis present

## 2024-07-01 DIAGNOSIS — I10 Essential (primary) hypertension: Secondary | ICD-10-CM | POA: Diagnosis present

## 2024-07-01 DIAGNOSIS — Z794 Long term (current) use of insulin: Secondary | ICD-10-CM | POA: Diagnosis not present

## 2024-07-01 DIAGNOSIS — G459 Transient cerebral ischemic attack, unspecified: Secondary | ICD-10-CM

## 2024-07-01 DIAGNOSIS — J45909 Unspecified asthma, uncomplicated: Secondary | ICD-10-CM | POA: Diagnosis present

## 2024-07-01 DIAGNOSIS — Z7985 Long-term (current) use of injectable non-insulin antidiabetic drugs: Secondary | ICD-10-CM

## 2024-07-01 DIAGNOSIS — R29704 NIHSS score 4: Secondary | ICD-10-CM | POA: Diagnosis present

## 2024-07-01 DIAGNOSIS — Z880 Allergy status to penicillin: Secondary | ICD-10-CM | POA: Diagnosis not present

## 2024-07-01 DIAGNOSIS — R000 Tachycardia, unspecified: Secondary | ICD-10-CM | POA: Diagnosis present

## 2024-07-01 DIAGNOSIS — E875 Hyperkalemia: Secondary | ICD-10-CM | POA: Diagnosis present

## 2024-07-01 DIAGNOSIS — G9389 Other specified disorders of brain: Secondary | ICD-10-CM | POA: Diagnosis present

## 2024-07-01 DIAGNOSIS — R5381 Other malaise: Secondary | ICD-10-CM | POA: Diagnosis present

## 2024-07-01 DIAGNOSIS — R29818 Other symptoms and signs involving the nervous system: Secondary | ICD-10-CM | POA: Diagnosis present

## 2024-07-01 DIAGNOSIS — R290 Tetany: Secondary | ICD-10-CM | POA: Diagnosis present

## 2024-07-01 LAB — COMPREHENSIVE METABOLIC PANEL WITH GFR
ALT: 90 U/L — ABNORMAL HIGH (ref 0–44)
AST: 44 U/L — ABNORMAL HIGH (ref 15–41)
Albumin: 3.7 g/dL (ref 3.5–5.0)
Alkaline Phosphatase: 165 U/L — ABNORMAL HIGH (ref 38–126)
Anion gap: 31 — ABNORMAL HIGH (ref 5–15)
BUN: 57 mg/dL — ABNORMAL HIGH (ref 8–23)
CO2: 10 mmol/L — ABNORMAL LOW (ref 22–32)
Calcium: 9 mg/dL (ref 8.9–10.3)
Chloride: 98 mmol/L (ref 98–111)
Creatinine, Ser: 3.35 mg/dL — ABNORMAL HIGH (ref 0.44–1.00)
GFR, Estimated: 14 mL/min — ABNORMAL LOW (ref >60)
Glucose, Bld: 1187 mg/dL (ref 70–99)
Potassium: 5.5 mmol/L — ABNORMAL HIGH (ref 3.5–5.1)
Sodium: 139 mmol/L (ref 135–145)
Total Bilirubin: 2.4 mg/dL — ABNORMAL HIGH (ref 0.0–1.2)
Total Protein: 7.3 g/dL (ref 6.5–8.1)

## 2024-07-01 LAB — CBC
HCT: 54.4 % — ABNORMAL HIGH (ref 36.0–46.0)
HCT: 46.7 % — ABNORMAL HIGH (ref 36.0–46.0)
Hemoglobin: 18.2 g/dL — ABNORMAL HIGH (ref 12.0–15.0)
Hemoglobin: 16.1 g/dL — ABNORMAL HIGH (ref 12.0–15.0)
MCH: 31.5 pg (ref 26.0–34.0)
MCH: 31.3 pg (ref 26.0–34.0)
MCHC: 33.5 g/dL (ref 30.0–36.0)
MCHC: 34.5 g/dL (ref 30.0–36.0)
MCV: 94.1 fL (ref 80.0–100.0)
MCV: 90.9 fL (ref 80.0–100.0)
Platelets: 200 K/uL (ref 150–400)
Platelets: 183 K/uL (ref 150–400)
RBC: 5.78 MIL/uL — ABNORMAL HIGH (ref 3.87–5.11)
RBC: 5.14 MIL/uL — ABNORMAL HIGH (ref 3.87–5.11)
RDW: 13.6 % (ref 11.5–15.5)
RDW: 13.2 % (ref 11.5–15.5)
WBC: 8.9 K/uL (ref 4.0–10.5)
WBC: 8.5 K/uL (ref 4.0–10.5)
nRBC: 0 % (ref 0.0–0.2)
nRBC: 0 % (ref 0.0–0.2)

## 2024-07-01 LAB — BASIC METABOLIC PANEL WITH GFR
Anion gap: 25 — ABNORMAL HIGH (ref 5–15)
Anion gap: 22 — ABNORMAL HIGH (ref 5–15)
Anion gap: 21 — ABNORMAL HIGH (ref 5–15)
Anion gap: 15 (ref 5–15)
BUN: 55 mg/dL — ABNORMAL HIGH (ref 8–23)
BUN: 56 mg/dL — ABNORMAL HIGH (ref 8–23)
BUN: 56 mg/dL — ABNORMAL HIGH (ref 8–23)
BUN: 50 mg/dL — ABNORMAL HIGH (ref 8–23)
CO2: 14 mmol/L — ABNORMAL LOW (ref 22–32)
CO2: 16 mmol/L — ABNORMAL LOW (ref 22–32)
CO2: 21 mmol/L — ABNORMAL LOW (ref 22–32)
CO2: 24 mmol/L (ref 22–32)
Calcium: 8.9 mg/dL (ref 8.9–10.3)
Calcium: 8.9 mg/dL (ref 8.9–10.3)
Calcium: 10 mg/dL (ref 8.9–10.3)
Calcium: 9.8 mg/dL (ref 8.9–10.3)
Chloride: 101 mmol/L (ref 98–111)
Chloride: 108 mmol/L (ref 98–111)
Chloride: 114 mmol/L — ABNORMAL HIGH (ref 98–111)
Chloride: 116 mmol/L — ABNORMAL HIGH (ref 98–111)
Creatinine, Ser: 3.24 mg/dL — ABNORMAL HIGH (ref 0.44–1.00)
Creatinine, Ser: 3.02 mg/dL — ABNORMAL HIGH (ref 0.44–1.00)
Creatinine, Ser: 2.45 mg/dL — ABNORMAL HIGH (ref 0.44–1.00)
Creatinine, Ser: 2 mg/dL — ABNORMAL HIGH (ref 0.44–1.00)
GFR, Estimated: 15 mL/min — ABNORMAL LOW (ref >60)
GFR, Estimated: 16 mL/min — ABNORMAL LOW (ref >60)
GFR, Estimated: 21 mL/min — ABNORMAL LOW (ref >60)
GFR, Estimated: 26 mL/min — ABNORMAL LOW (ref >60)
Glucose, Bld: 1173 mg/dL (ref 70–99)
Glucose, Bld: 828 mg/dL (ref 70–99)
Glucose, Bld: 205 mg/dL — ABNORMAL HIGH (ref 70–99)
Glucose, Bld: 357 mg/dL — ABNORMAL HIGH (ref 70–99)
Potassium: 5.1 mmol/L (ref 3.5–5.1)
Potassium: 3.8 mmol/L (ref 3.5–5.1)
Potassium: 3.9 mmol/L (ref 3.5–5.1)
Potassium: 3.9 mmol/L (ref 3.5–5.1)
Sodium: 140 mmol/L (ref 135–145)
Sodium: 146 mmol/L — ABNORMAL HIGH (ref 135–145)
Sodium: 156 mmol/L — ABNORMAL HIGH (ref 135–145)
Sodium: 154 mmol/L — ABNORMAL HIGH (ref 135–145)

## 2024-07-01 LAB — URINALYSIS, ROUTINE W REFLEX MICROSCOPIC
Bilirubin Urine: NEGATIVE
Glucose, UA: >=500 mg/dL — AB
Ketones, ur: 20 mg/dL — AB
Nitrite: NEGATIVE
Protein, ur: 30 mg/dL — AB
Specific Gravity, Urine: 1.027 (ref 1.005–1.030)
pH: 5 (ref 5.0–8.0)

## 2024-07-01 LAB — I-STAT CG4 LACTIC ACID, ED
Lactic Acid, Venous: 2.6 mmol/L (ref 0.5–1.9)
Lactic Acid, Venous: 3.9 mmol/L (ref 0.5–1.9)

## 2024-07-01 LAB — I-STAT CHEM 8, ED
BUN: 52 mg/dL — ABNORMAL HIGH (ref 8–23)
Calcium, Ion: 1.2 mmol/L (ref 1.15–1.40)
Chloride: 105 mmol/L (ref 98–111)
Creatinine, Ser: 2.8 mg/dL — ABNORMAL HIGH (ref 0.44–1.00)
Glucose, Bld: >700 mg/dL (ref 70–99)
HCT: 54 % — ABNORMAL HIGH (ref 36.0–46.0)
Hemoglobin: 18.4 g/dL — ABNORMAL HIGH (ref 12.0–15.0)
Potassium: 5.4 mmol/L — ABNORMAL HIGH (ref 3.5–5.1)
Sodium: 140 mmol/L (ref 135–145)
TCO2: 13 mmol/L — ABNORMAL LOW (ref 22–32)

## 2024-07-01 LAB — CBG MONITORING, ED
Glucose-Capillary: 127 mg/dL — ABNORMAL HIGH (ref 70–99)
Glucose-Capillary: 161 mg/dL — ABNORMAL HIGH (ref 70–99)
Glucose-Capillary: 164 mg/dL — ABNORMAL HIGH (ref 70–99)
Glucose-Capillary: 173 mg/dL — ABNORMAL HIGH (ref 70–99)
Glucose-Capillary: 205 mg/dL — ABNORMAL HIGH (ref 70–99)
Glucose-Capillary: 221 mg/dL — ABNORMAL HIGH (ref 70–99)
Glucose-Capillary: 222 mg/dL — ABNORMAL HIGH (ref 70–99)
Glucose-Capillary: 241 mg/dL — ABNORMAL HIGH (ref 70–99)
Glucose-Capillary: 243 mg/dL — ABNORMAL HIGH (ref 70–99)
Glucose-Capillary: 244 mg/dL — ABNORMAL HIGH (ref 70–99)
Glucose-Capillary: 257 mg/dL — ABNORMAL HIGH (ref 70–99)
Glucose-Capillary: 298 mg/dL — ABNORMAL HIGH (ref 70–99)
Glucose-Capillary: 366 mg/dL — ABNORMAL HIGH (ref 70–99)
Glucose-Capillary: 388 mg/dL — ABNORMAL HIGH (ref 70–99)
Glucose-Capillary: 427 mg/dL — ABNORMAL HIGH (ref 70–99)
Glucose-Capillary: 498 mg/dL — ABNORMAL HIGH (ref 70–99)
Glucose-Capillary: >600 mg/dL (ref 70–99)
Glucose-Capillary: >600 mg/dL (ref 70–99)
Glucose-Capillary: >600 mg/dL (ref 70–99)
Glucose-Capillary: >600 mg/dL (ref 70–99)
Glucose-Capillary: >600 mg/dL (ref 70–99)
Glucose-Capillary: >600 mg/dL (ref 70–99)
Glucose-Capillary: >600 mg/dL (ref 70–99)

## 2024-07-01 LAB — BETA-HYDROXYBUTYRIC ACID
Beta-Hydroxybutyric Acid: >8 mmol/L — ABNORMAL HIGH (ref 0.05–0.27)
Beta-Hydroxybutyric Acid: 7.82 mmol/L — ABNORMAL HIGH (ref 0.05–0.27)
Beta-Hydroxybutyric Acid: 1.37 mmol/L — ABNORMAL HIGH (ref 0.05–0.27)
Beta-Hydroxybutyric Acid: 1.34 mmol/L — ABNORMAL HIGH (ref 0.05–0.27)

## 2024-07-01 LAB — PROTIME-INR
INR: 1.2 (ref 0.8–1.2)
Prothrombin Time: 15.4 s — ABNORMAL HIGH (ref 11.4–15.2)

## 2024-07-01 LAB — I-STAT VENOUS BLOOD GAS, ED
Acid-base deficit: 13 mmol/L — ABNORMAL HIGH (ref 0.0–2.0)
Bicarbonate: 13.7 mmol/L — ABNORMAL LOW (ref 20.0–28.0)
Calcium, Ion: 1.17 mmol/L (ref 1.15–1.40)
HCT: 46 % (ref 36.0–46.0)
Hemoglobin: 15.6 g/dL — ABNORMAL HIGH (ref 12.0–15.0)
O2 Saturation: 99 %
Potassium: 5.2 mmol/L — ABNORMAL HIGH (ref 3.5–5.1)
Sodium: 138 mmol/L (ref 135–145)
TCO2: 15 mmol/L — ABNORMAL LOW (ref 22–32)
pCO2, Ven: 33.4 mmHg — ABNORMAL LOW (ref 44–60)
pH, Ven: 7.221 — ABNORMAL LOW (ref 7.25–7.43)
pO2, Ven: 139 mmHg — ABNORMAL HIGH (ref 32–45)

## 2024-07-01 LAB — HEMOGLOBIN A1C
Hgb A1c MFr Bld: 16.7 % — ABNORMAL HIGH (ref 4.8–5.6)
Mean Plasma Glucose: 432.59 mg/dL

## 2024-07-01 LAB — DIFFERENTIAL
Abs Immature Granulocytes: 0.04 K/uL (ref 0.00–0.07)
Basophils Absolute: 0 K/uL (ref 0.0–0.1)
Basophils Relative: 0 %
Eosinophils Absolute: 0 K/uL (ref 0.0–0.5)
Eosinophils Relative: 0 %
Immature Granulocytes: 0 %
Lymphocytes Relative: 6 %
Lymphs Abs: 0.5 K/uL — ABNORMAL LOW (ref 0.7–4.0)
Monocytes Absolute: 1 K/uL (ref 0.1–1.0)
Monocytes Relative: 12 %
Neutro Abs: 7.3 K/uL (ref 1.7–7.7)
Neutrophils Relative %: 82 %

## 2024-07-01 LAB — OSMOLALITY: Osmolality: 395 mosm/kg (ref 275–295)

## 2024-07-01 LAB — APTT: aPTT: 22 s — ABNORMAL LOW (ref 24–36)

## 2024-07-01 LAB — TROPONIN I (HIGH SENSITIVITY)
Troponin I (High Sensitivity): 36 ng/L — ABNORMAL HIGH (ref <18)
Troponin I (High Sensitivity): 38 ng/L — ABNORMAL HIGH (ref <18)

## 2024-07-01 LAB — ETHANOL: Alcohol, Ethyl (B): <15 mg/dL (ref <15)

## 2024-07-01 LAB — HIV ANTIBODY (ROUTINE TESTING W REFLEX): HIV Screen 4th Generation wRfx: NONREACTIVE

## 2024-07-01 MED ORDER — DEXTROSE 50 % IV SOLN: 0.0000 mL | INTRAVENOUS | Status: DC | PRN

## 2024-07-01 MED ORDER — INSULIN REGULAR(HUMAN) IN NACL 100-0.9 UT/100ML-% IV SOLN
INTRAVENOUS | Status: DC
  Administered 2024-07-01: 03:00:00 11.5 [IU]/h via INTRAVENOUS
  Filled 2024-07-01: qty 100

## 2024-07-01 MED ORDER — LEVETIRACETAM (KEPPRA) 500 MG/5 ML ADULT IV PUSH
2500.0000 mg | Freq: Once | INTRAVENOUS | Status: AC
  Administered 2024-07-01: 01:00:00 2500 mg via INTRAVENOUS
  Filled 2024-07-01: qty 25

## 2024-07-01 MED ORDER — IOHEXOL 350 MG/ML SOLN
75.0000 mL | Freq: Once | INTRAVENOUS | Status: AC | PRN
  Administered 2024-07-01: 01:00:00 75 mL via INTRAVENOUS

## 2024-07-01 MED ORDER — LACTATED RINGERS IV SOLN: INTRAVENOUS | Status: DC

## 2024-07-01 MED ORDER — LACTATED RINGERS IV BOLUS
1000.0000 mL | Freq: Once | INTRAVENOUS | Status: AC
  Administered 2024-07-01: 01:00:00 1000 mL via INTRAVENOUS

## 2024-07-01 MED ORDER — SODIUM CHLORIDE 0.9% FLUSH
3.0000 mL | Freq: Once | INTRAVENOUS | Status: AC
  Administered 2024-07-01: 03:00:00 3 mL via INTRAVENOUS

## 2024-07-01 MED ORDER — LEVETIRACETAM (KEPPRA) 500 MG/5 ML ADULT IV PUSH
500.0000 mg | Freq: Two times a day (BID) | INTRAVENOUS | Status: AC
  Administered 2024-07-01 – 2024-07-02 (×3): 500 mg via INTRAVENOUS
  Filled 2024-07-01 (×3): qty 5

## 2024-07-01 MED ORDER — HEPARIN SODIUM (PORCINE) 5000 UNIT/ML IJ SOLN
5000.0000 [IU] | Freq: Three times a day (TID) | INTRAMUSCULAR | Status: DC
  Administered 2024-07-01 – 2024-07-07 (×16): 5000 [IU] via SUBCUTANEOUS
  Filled 2024-07-01 (×17): qty 1

## 2024-07-01 MED ORDER — INSULIN REGULAR(HUMAN) IN NACL 100-0.9 UT/100ML-% IV SOLN
INTRAVENOUS | Status: DC
  Administered 2024-07-01: 05:00:00 11.5 [IU]/h via INTRAVENOUS
  Administered 2024-07-01 – 2024-07-02 (×3): 4.6 [IU]/h via INTRAVENOUS
  Filled 2024-07-01 (×2): qty 100

## 2024-07-01 MED ORDER — DEXTROSE IN LACTATED RINGERS 5 % IV SOLN: INTRAVENOUS | Status: DC

## 2024-07-01 MED ORDER — LEVETIRACETAM 500 MG PO TABS
500.0000 mg | ORAL_TABLET | Freq: Two times a day (BID) | ORAL | Status: DC
  Administered 2024-07-01: 11:00:00 500 mg via ORAL
  Filled 2024-07-01: qty 1

## 2024-07-01 MED ORDER — SODIUM CHLORIDE 0.9 % IV SOLN
1.0000 g | Freq: Every day | INTRAVENOUS | Status: DC
  Administered 2024-07-01 – 2024-07-04 (×4): 1 g via INTRAVENOUS
  Filled 2024-07-01 (×4): qty 10

## 2024-07-01 NOTE — ED Notes (Signed)
 CBG [id number]

## 2024-07-01 NOTE — Inpatient Diabetes Management (Signed)
 Inpatient Diabetes Program Recommendations  AACE/ADA: New Consensus Statement on Inpatient Glycemic Control (2015)  Target Ranges:  Prepandial:   less than 140 mg/dL      Peak postprandial:   less than 180 mg/dL (1-2 hours)      Critically ill patients:  140 - 180 mg/dL   Lab Results  Component Value Date   GLUCAP 161 (H) 07/01/2024   HGBA1C 16.7 (H) 07/01/2024    Review of Glycemic Control  Diabetes history: DM2 Outpatient Diabetes medications: metformin 500 mg BID, Ozempic 2 mg weekly Current orders for Inpatient glycemic control: IV insulin  per [employer] for DKA  HgbA1C - 16.7% BHB still elevated at 1.34, although AG 15 and CO2 24 Last PCP appt - 12/07/2023 HgbA1C was 6.4% at this OV.  Inpatient Diabetes Program Recommendations:    When criteria met for discontinuation of insulin  gtt, give Semglee  1-2 hours prior to discontinuation of drip. Consider:  Semglee  15 units daily Novolog  0-9 TID with meals and 0-5 HS Novolog  4 units TID with meals if eating > 50%  Spoke with pt's best friend (cousin?) and her husband from [place]. States pt has not been taking care of herself, not moving around very much and not controlling blood sugars.    No monitoring at home.   Will f/u with pt on 12/17 to discuss healthy diet, monitoring, f/u with PCP and obtaining CGM. Discuss HgbA1C of 16.7%.  Continue to follow.  Thank you. Shona Brandy, RD, LDN, CDCES Inpatient Diabetes Coordinator [phone]

## 2024-07-01 NOTE — ED Provider Notes (Signed)
 [place] EMERGENCY DEPARTMENT AT [facility] Provider Note   CSN: [id number] Arrival date & time: 07/01/24  9953  An emergency department physician performed an initial assessment on this suspected stroke patient at 0046.  History Chief Complaint  Patient presents with   Code Stroke    HPI: Michelle Henderson is a 68 y.o. female with history pertinent for asthma, T2DM, HTN, obesity who presents complaining of altered mental status, neurologic deficits. Patient arrived via EMS.  History provided by patient.  No interpreter required during this encounter.  Patient reports that her last known normal is when she was getting into her car and going for a drive, she cannot remember where to.  Reports that the send was going down when this happened, therefore likely sometime between 430 and 6 PM.  Concerned bystanders called EMS when they noticed that the patient was parked in a elementary school parking lot for prolonged time.  Police did a wellness check and noted that the patient was altered, therefore called EMS.  EMS noted right arm drift, inconsistent ability to follow commands, and intermittent aphasia.  Also was noted to have intermittent right hand carpopedal spasm concerning for focal seizure activity.  Patient was found to have hyperglycemia and route with EMS as well as hypertension.  Patient's recorded medical, surgical, social, medication list and allergies were reviewed in the Snapshot window as part of the initial history.   Prior to Admission medications  Medication Sig Start Date End Date Taking? Authorizing Provider  albuterol  (PROVENTIL  HFA;VENTOLIN  HFA) 108 (90 BASE) MCG/ACT inhaler Inhale 2 puffs into the lungs every 6 (six) hours as needed for shortness of breath or wheezing. 01/11/15   [provider]  atorvastatin  (LIPITOR) 20 MG tablet Take 20 mg by mouth every other day. 04/29/22   [provider]  Calcium  250 MG CAPS Take 1 tablet by mouth  3 (three) times daily.    [provider]  fexofenadine (ALLEGRA) 180 MG tablet Take 180 mg by mouth daily.    [provider]  fluticasone  (FLONASE ) 50 MCG/ACT nasal spray Place 2 sprays into both nostrils daily. 06/14/22   [provider]  glucosamine-chondroitin 500-400 MG tablet Take 2 tablets by mouth daily.    [provider]  hydrochlorothiazide (HYDRODIURIL) 25 MG tablet Take 25 mg by mouth daily. 02/15/15   [provider]  lisinopril (ZESTRIL) 40 MG tablet Take 40 mg by mouth daily. 02/15/15   [provider]  metFORMIN (GLUCOPHAGE-XR) 500 MG 24 hr tablet Take 500 mg by mouth 2 (two) times daily. 02/16/15   [provider]  Multiple Vitamins-Minerals (CENTRUM SILVER 50+WOMEN PO) Take 1 tablet by mouth daily.    [provider]  Multiple Vitamins-Minerals (PRESERVISION AREDS 2 PO) Take 2 tablets by mouth daily.    [provider]  OZEMPIC, 1 MG/DOSE, 4 MG/3ML SOPN Inject 1 mg into the skin once a week. 07/07/22   [provider]  Probiotic Product (ACIDOPHILUS/GOAT MILK) CAPS Take 1 capsule by mouth daily.    [provider]     Allergies: Penicillins   Review of Systems   ROS as per HPI  Physical Exam Updated Vital Signs BP (!) 142/100 (BP Location: Left Arm)   Pulse (!) 113   Temp (!) 97.5 F (36.4 C) (Oral)   Resp 12   SpO2 100%  Physical Exam Vitals and nursing note reviewed.  Constitutional:      General: She is not in acute  distress.    Appearance: She is well-developed.  HENT:     Head: Normocephalic and atraumatic.  Eyes:     Conjunctiva/sclera: Conjunctivae normal.  Cardiovascular:     Rate and Rhythm: Regular rhythm. Tachycardia present.     Heart sounds: No murmur heard. Pulmonary:     Effort: Pulmonary effort is normal. No respiratory distress.     Breath sounds: Normal breath sounds.  Abdominal:     Palpations: Abdomen is soft.     Tenderness: There is no  abdominal tenderness.  Musculoskeletal:        General: No swelling.     Cervical back: Neck supple.  Skin:    General: Skin is warm and dry.     Capillary Refill: Capillary refill takes less than 2 seconds.  Neurological:     Mental Status: She is alert. She is disoriented.     Motor: Weakness (Strength 4/5 in right upper extremity versus 5/5 left upper extremity) present.     Comments: Expressive aphasia  Psychiatric:        Mood and Affect: Mood normal.     ED Course/ Medical Decision Making/ A&P    Procedures .Critical Care  Performed by: Rogelia Jerilynn RAMAN, MD Authorized by: Rogelia Jerilynn RAMAN, MD   Critical care provider statement:    Critical care time (minutes):  45   Critical care was necessary to treat or prevent imminent or life-threatening deterioration of the following conditions:  CNS failure or compromise and endocrine crisis (Code stroke and DKA)   Critical care was time spent personally by me on the following activities:  Development of treatment plan with patient or surrogate, discussions with consultants, evaluation of patient's response to treatment, examination of patient, ordering and review of laboratory studies, ordering and review of radiographic studies, ordering and performing treatments and interventions, pulse oximetry, re-evaluation of patient's condition and review of old charts    Medications Ordered in ED Medications  sodium chloride  flush (NS) 0.9 % injection 3 mL (has no administration in time range)  insulin  regular, human (MYXREDLIN ) 100 units/ 100 mL infusion (has no administration in time range)  lactated ringers  infusion (has no administration in time range)  dextrose  5 % in lactated ringers  infusion (has no administration in time range)  dextrose  50 % solution 0-50 mL (has no administration in time range)  iohexol  (OMNIPAQUE ) 350 MG/ML injection 75 mL (75 mLs Intravenous Contrast Given 07/01/24 0056)  lactated ringers  bolus 1,000 mL  (1,000 mLs Intravenous New Bag/Given 07/01/24 0114)  levETIRAcetam  (KEPPRA ) undiluted injection 2,500 mg (2,500 mg Intravenous Given 07/01/24 0114)    Medical Decision Making:   Shaletta Hinostroza is a 68 y.o. female who presents for hyperglycemia, neurologic deficits as per above.  Physical exam is pertinent for aphasia, right upper extremity weakness.   The differential includes but is not limited to DKA, HHS, seizure, focal seizure, status epilepticus, ICH, ischemic stroke, TIA, hyperglycemia related,.  Independent historian: EMS  External data reviewed: Labs: reviewed prior labs for baseline  Initial Plan:  Code stroke labs including Chem-8, CMP, CBC, coags, ethanol Code stroke CT head and CTA EKG to evaluate for cardiac pathology Objective evaluation as below reviewed   Labs: Ordered, Independent interpretation, and Details: CBC without leukocytosis or thrombocytopenia.  Patient with erythrocytosis, which is not baseline, concerning for hemoconcentration.  Coags reassuring.  CMP with significant hyperglycemia greater than thousand, with anion gap elevation to 31, and diminished bicarb concerning for DKA. Additionally, AKI to 3.35  from baseline of 1.  Ethanol undetectable.  Radiology: Ordered, Independent interpretation, Details: Personally viewed CT head and CTA, do not appreciate ICH, loss of gray/white matter differentiation, MLS or LVO,, and All images reviewed independently.  Agree with radiology report at this time.   CT ANGIO HEAD NECK W WO CM (CODE STROKE) Result Date: 07/01/2024 EXAM: CTA Head and Neck with Intravenous Contrast. CT Head without Contrast. CLINICAL HISTORY: Neuro deficit, acute, stroke suspected. TECHNIQUE: Axial CTA images of the head and neck performed with intravenous contrast. MIP reconstructed images were created and reviewed. Axial computed tomography images of the head/brain performed without intravenous contrast. Note: Per PQRS, the description of  internal carotid artery percent stenosis, including 0 percent or normal exam, is based on North American Symptomatic Carotid Endarterectomy Trial (NASCET) criteria. Dose reduction technique was used including one or more of the following: automated exposure control, adjustment of mA and kV according to patient size, and/or iterative reconstruction. CONTRAST: With; COMPARISON: CT from 07/01/2024. FINDINGS: CT HEAD: BRAIN: No acute intraparenchymal hemorrhage. No mass lesion. No CT evidence for acute territorial infarct. No midline shift or extra-axial collection. VENTRICLES: No hydrocephalus. ORBITS: The orbits are unremarkable. SINUSES AND MASTOIDS: The paranasal sinuses and mastoid air cells are clear. CTA NECK: COMMON CAROTID ARTERIES: Mild atheromatous change about the right carotid bulb without stenosis. No significant stenosis of the left common carotid artery. No dissection or occlusion. INTERNAL CAROTID ARTERIES: No stenosis by NASCET criteria. No dissection or occlusion. VERTEBRAL ARTERIES: No significant stenosis. No dissection or occlusion. CTA HEAD: ANTERIOR CEREBRAL ARTERIES: No significant stenosis. No occlusion. No aneurysm. MIDDLE CEREBRAL ARTERIES: No significant stenosis. No occlusion. No aneurysm. POSTERIOR CEREBRAL ARTERIES: No significant stenosis. No occlusion. No aneurysm. BASILAR ARTERY: No significant stenosis. No occlusion. No aneurysm. OTHER: SOFT TISSUES: No acute finding. No masses or lymphadenopathy. BONES: Moderate multilevel cervical spondylosis, most pronounced at C7-T1. No acute osseous abnormality of the visualized portions of the skull. IMPRESSION: 1. Negative CTA for acute . LVO or other abnormality. 2. Findings communicated by text page to Dr. Vanessa at 1:13 am on 07/01/2024. Electronically signed by: Morene Hoard MD 07/01/2024 01:17 AM EST RP Workstation: [id number]   CT HEAD CODE STROKE WO CONTRAST Result Date: 07/01/2024 EXAM: CT HEAD WITHOUT CONTRAST 07/01/2024  12:57:46 AM TECHNIQUE: CT of the head was performed without the administration of intravenous contrast. Automated exposure control, iterative reconstruction, and/or weight based adjustment of the mA/kV was utilized to reduce the radiation dose to as low as reasonably achievable. COMPARISON: None available. CLINICAL HISTORY: Neuro deficit, acute, stroke suspected Neuro deficit, acute, stroke suspected FINDINGS: BRAIN AND VENTRICLES: No acute hemorrhage. No evidence of acute infarct. No hydrocephalus. No extra-axial collection. No mass effect or midline shift. ORBITS: No acute abnormality. SINUSES: No acute abnormality. SOFT TISSUES AND SKULL: No acute soft tissue abnormality. No skull fracture. IMPRESSION: 1. No acute intracranial abnormality. 2. Aspects is 10. 3. Findings communicated by text page to Dr. Vanessa at 1:01 am on 07/01/2024. Electronically signed by: Morene Hoard MD 07/01/2024 01:02 AM EST RP Workstation: [id number]    EKG/Medicine tests: Ordered and Independent interpretation EKG Interpretation: Sinus tachycardia Biatrial enlargement Probable anterior infarct, old Confirmed by Rogelia Satterfield ([id number]) on 07/01/2024 2:33:25 AM                Interventions: Keppra , LR bolus, insulin  gtt.  See the EMR for full details regarding lab and imaging results.  Patient presents to the emergency department as a code stroke activated prior to  arrival.  Patient with aphasia and right upper extremity drift on arrival.  Patient evaluated on arrival contemporaneously by Dr. Vanessa with neurology as well as myself.  Patient underwent CT imaging and this was negative for acute stroke.  Patient did have reported carpopedal spasm concerning for focal seizure-like activity, per Dr. Khaliqdina this can be seen in patients with significant hyperglycemia, therefore patient was given Keppra , and Dr. Vanessa recommended further evaluation inpatient with MRI and EEG.  Labs do demonstrate evidence of  significant AKI as well as DKA.  Patient has history of T2DM on Ozempic and metformin, is not chronically on insulin .  On reevaluation, patient with past history of T2DM on Ozempic and metformin, is not chronically on insulin .  On reevaluation, patient with normalization of mental status, no focal neurologic deficits, no aphasia, however has poor recollection of her episode of altered mental status earlier today.  Insulin  GGT was ordered, feel that patient warrants admission for further evaluation for transient neurologic deficits, as well as AKI and DKA.  Hospitalist consulted, discussed with Dr. Franky, who accepted the patient to his service.   Presentation is most consistent with acute life/limb-threatening illness and Current presentation is complicated by underlying chronic conditions  Discussion of management or test interpretations with external provider(s): Hospitalist, Dr. Franky.  Risk Drugs:Prescription drug management Treatment: Decision regarding hospitalization Critical Care: 45 minutes  Disposition: ADMIT: I believe the patient requires admission for further care and management. The patient was admitted to hospitalists. Please see inpatient provider note for additional treatment plan details.   MDM generated using voice dictation software and may contain dictation errors.  Please contact me for any clarification or with any questions.  Clinical Impression:  1. Diabetic ketoacidosis with coma associated with type 2 diabetes mellitus (HCC)   2. TIA (transient ischemic attack)   3. Seizure-like activity (HCC)   4. AKI (acute kidney injury)      Admit   Final Clinical Impression(s) / ED Diagnoses Final diagnoses:  Diabetic ketoacidosis with coma associated with type 2 diabetes mellitus (HCC)  TIA (transient ischemic attack)  Seizure-like activity (HCC)  AKI (acute kidney injury)    Rx / DC Orders ED Discharge Orders     None        Rogelia Jerilynn RAMAN,  MD 07/01/24 [phone]

## 2024-07-01 NOTE — Progress Notes (Signed)
 EEG complete - results pending

## 2024-07-01 NOTE — ED Triage Notes (Signed)
 Pt BIB [facility] from parking lot. Bystander called EMS after finding pt sitting in running car around 2330. PT was appeared to be confused on arrival with some right arm deficits and difficulty following commands.   EMS Vitals: 220/110, 150/70 CBG 522

## 2024-07-01 NOTE — Procedures (Signed)
 Patient Name: Michelle Henderson  MRN: [id number]  Epilepsy Attending: Arlin MALVA Krebs  Referring Physician/Provider: Khaliqdina, Salman, MD  Date: 07/01/2024 Duration: 22.07 mins  Patient history:  68 y.o. female who was found in her car in the parking lot of an elementary school. Noted to have aphasia slightly out of proportion to confusion along with mild RUE weakness and R hand intermittent twitching. EEG to evaluate for seizure  Level of alertness: Awake  AEDs during EEG study: LEV  Technical aspects: This EEG study was done with scalp electrodes positioned according to the 10-20 International system of electrode placement. Electrical activity was reviewed with band pass filter of 1-70Hz , sensitivity of 7 uV/mm, display speed of 66mm/sec with a 60Hz  notched filter applied as appropriate. EEG data were recorded continuously and digitally stored.  Video monitoring was available and reviewed as appropriate.  Description: EEG showed continuous generalized predominantly 5 to 7 Hz theta slowing admixed with intermittent 2-3hz  delta slowing. Hyperventilation and photic stimulation were not performed.     ABNORMALITY - Continuous slow, generalized  IMPRESSION: This study is suggestive of generalized cerebral dysfunction (encephalopathy). No seizures or epileptiform discharges were seen throughout the recording.  Athol Bolds [place]ch

## 2024-07-01 NOTE — Evaluation (Addendum)
 Clinical/Bedside Swallow Evaluation Patient Details  Name: Michelle Henderson MRN: [id number] Date of Birth: 12-04-1955  Today's Date: 07/01/2024 Time: SLP Start Time (ACUTE ONLY): 1431 SLP Stop Time (ACUTE ONLY): 1447 SLP Time Calculation (min) (ACUTE ONLY): 16 min  Past Medical History:  Past Medical History:  Diagnosis Date   Asthma    Diabetes mellitus without complication (HCC)    Hypertension    Obesity    Past Surgical History:  Past Surgical History:  Procedure Laterality Date   TONSILLECTOMY  1960   HPI:  68 yo female presenting to ED 12/16 after being found with AMS and RUE weakness in her car. CTH and CTA negative. MRI pending. EEG suggestive of encephalopathy without seizures. PMH includes asthma, T2DM, HTN    Assessment / Plan / Recommendation  Clinical Impression  Recommend starting with full liquid diet in light of mentation. Suspect prognosis for advancing to regular solids is good as she is more consistently alert. May need to try pills whole or crushed in puree in an effort to facilitate swift oral transit.   Pt initially passed the swallow screen but later held pills in her mouth per RN and was kept NPO. She is lethargic and only able to follow ~25% of commands but no focal CN deficits are noted. Sips of thin liquids were observed without signs clinically concerning for aspiration. With solids, mastication was disorganized with intermittent oral holding. Extensive prompting was needed to convince her to use a liquid wash to clear the bolus but residuals were eventually cleared with this strategy.   SLP Visit Diagnosis: Dysphagia, unspecified (R13.10)    Aspiration Risk  Mild aspiration risk    Diet Recommendation           Other Recommendations Oral Care Recommendations: Oral care BID     Swallow Evaluation Recommendations Recommendations: PO diet PO Diet Recommendation: Full liquid diet Liquid Administration via: Spoon;Cup;Straw Medication  Administration: Whole meds with puree Supervision: Full assist for feeding;Full supervision/cueing for swallowing strategies Swallowing strategies  : Slow rate;Small bites/sips Postural changes: Position pt fully upright for meals Oral care recommendations: Oral care BID (2x/day)   Assistance Recommended at Discharge    Functional Status Assessment Patient has had a recent decline in their functional status and demonstrates the ability to make significant improvements in function in a reasonable and predictable amount of time.  Frequency and Duration min 2x/week  1 week       Prognosis Prognosis for improved oropharyngeal function: Good Barriers to Reach Goals: Time post onset      Swallow Study   General HPI: 68 yo female presenting to ED 12/16 after being found with AMS and RUE weakness in her car. CTH and CTA negative. MRI pending. EEG suggestive of encephalopathy without seizures. PMH includes asthma, T2DM, HTN Type of Study: Bedside Swallow Evaluation Previous Swallow Assessment: none in chart Diet Prior to this Study: NPO Temperature Spikes Noted: No Respiratory Status: Room air History of Recent Intubation: No Behavior/Cognition: Lethargic/Drowsy;Requires cueing Oral Cavity Assessment: Within Functional Limits Oral Care Completed by SLP: No Oral Cavity - Dentition: Adequate natural dentition Vision: Functional for self-feeding Self-Feeding Abilities: Able to feed self Patient Positioning: Upright in bed Baseline Vocal Quality: Normal Volitional Cough: Cognitively unable to elicit Volitional Swallow: Unable to elicit    Oral/Motor/Sensory Function Overall Oral Motor/Sensory Function: Within functional limits   Ice Chips Ice chips: Not tested   Thin Liquid Thin Liquid: Within functional limits Presentation: Straw    Nectar Thick  Nectar Thick Liquid: Not tested   Honey Thick Honey Thick Liquid: Not tested   Puree Puree: Not tested   Solid     Solid:  Impaired Presentation: Self Fed Oral Phase Impairments: Poor awareness of bolus Oral Phase Functional Implications: Prolonged oral transit;Oral holding      Damien Blumenthal, M.A., CCC-SLP Speech Language Pathology, Acute Rehabilitation Services  Secure Chat preferred [phone]  07/01/2024,3:21 PM

## 2024-07-01 NOTE — Progress Notes (Addendum)
 Patient admitted after midnight, please see H&P.  Patient found to be in DKA/HHS.  Labs pending now and have been ordered every 4 hours to monitor.  Patient appears very dehydrated so we will continue aggressive IV fluids.  MRI of the brain is pending.  EEG shows generalized cerebral dysfunction-no seizures.  Home med list still pending.  negative for alcohol  Discussed with her cousin at bedside-patient lives alone in a one-story house.  Patient has been appearing to have more trouble getting around lately.  Patient's car also was found to be more disheveled and cousin attributes this to her inability to move well.  JV

## 2024-07-01 NOTE — Progress Notes (Signed)
 Pt in MRI, admission not completed

## 2024-07-01 NOTE — ED Notes (Signed)
 Family left verbal message: If cousin Madison Kerns calls, please give update on pt. Status, Thank you

## 2024-07-01 NOTE — Code Documentation (Signed)
 Responded to Code Stroke called at 0036 for R sided coordination problems, aphasia, and confusion, LSN-unknown. Pt arrived at 0046, CBG reading hi on meter, NIH-4 for LOC questions, BLE drift, and mild aphasia, CT head negative for acute changes, CTA-no LVO. TNK not given-unknown LSN. Plan: Keppra , metabolic w/o. Please complete VS/neuro checks(including NIH) q2h x 12h, then q4h.

## 2024-07-01 NOTE — Consult Note (Signed)
 NEUROLOGY CONSULT NOTE   Date of service: July 01, 2024 Patient Name: Michelle Henderson MRN:  [id number] DOB:  22-Sep-1955 Chief Complaint: R arm drift, expressive aphasia Requesting Provider: Rogelia Jerilynn RAMAN, MD  History of Present Illness  Michelle Henderson is a 68 y.o. female with hx of asthma, DM2, HTN, obesity who was found in her car in the parking lot of an elementary school. The car was running and she was not making sense. EMS noted R arm drift, inconsistent speech with some difficulty with finding words and then not following commands correctly. She was noted to have carpopedal spasm in the R hand intermittently.  She was hyperglycemic to over glucose of 500 and hypertensive to SBP of 220. LKW was unclear. She was brought in as a code stroke.  LKW: unclear Modified rankin score: presumed very functional at baseline given she was driving. IV Thrombolysis: not offered 2/2 unclear LKW. EVT: not offered, no LVO.  NIHSS components Score: Comment  1a Level of Conscious 0[x]  1[]  2[]  3[]      1b LOC Questions 0[]  1[x]  2[]       1c LOC Commands 0[x]  1[]  2[]       2 Best Gaze 0[x]  1[]  2[]       3 Visual 0[x]  1[]  2[]  3[]      4 Facial Palsy 0[x]  1[]  2[]  3[]      5a Motor Arm - left 0[x]  1[]  2[]  3[]  4[]  UN[]    5b Motor Arm - Right 0[x]  1[]  2[]  3[]  4[]  UN[]    6a Motor Leg - Left 0[]  1[x]  2[]  3[]  4[]  UN[]    6b Motor Leg - Right 0[]  1[x]  2[]  3[]  4[]  UN[]    7 Limb Ataxia 0[x]  1[]  2[]  UN[]      8 Sensory 0[x]  1[]  2[]  UN[]      9 Best Language 0[]  1[x]  2[]  3[]      10 Dysarthria 0[x]  1[]  2[]  UN[]      11 Extinct. and Inattention 0[x]  1[]  2[]       TOTAL: 4      ROS  Unable to ascertain due to mild aphasia.  Past History   Past Medical History:  Diagnosis Date   Asthma    Diabetes mellitus without complication (HCC)    Hypertension    Obesity     Past Surgical History:  Procedure Laterality Date   TONSILLECTOMY  56    Family History: Family History  Problem  Relation Age of Onset   Breast cancer Neg Hx     Social History  reports that she has never smoked. She has never used smokeless tobacco. She reports current alcohol use. She reports that she does not use drugs.  Allergies[1]  Medications  Current Medications[2]  Vitals   Vitals:   07/01/24 0056  BP: (!) 142/100  Pulse: (!) 113  Resp: 12  SpO2: 100%    There is no height or weight on file to calculate BMI.   Physical Exam   General: Laying comfortably in bed; in no acute distress.  HENT: Normal oropharynx and mucosa. Normal external appearance of ears and nose.  Neck: Supple, no pain or tenderness  CV: No JVD. No peripheral edema.  Pulmonary: Symmetric Chest rise. Normal respiratory effort.  Abdomen: Soft to touch, non-tender.  Ext: No cyanosis, edema, or deformity  Skin: No rash. Normal palpation of skin.   Musculoskeletal: Normal digits and nails by inspection. No clubbing.   Neurologic Examination  Mental status/Cognition: Alert, oriented to self, place, but thinks this is November. Oriented  to year, poor attention.  Speech/language: Fluent, comprehension intact to simple commands, names some but not all objects. Cranial nerves:   CN II Pupils equal and reactive to light, no VF deficits    CN III,IV,VI EOM intact, no gaze preference or deviation, no nystagmus    CN V normal sensation in V1, V2, and V3 segments bilaterally    CN VII no asymmetry, no nasolabial fold flattening    CN VIII normal hearing to speech    CN IX & X normal palatal elevation, no uvular deviation    CN XI 5/5 head turn and 5/5 shoulder shrug bilaterally    CN XII midline tongue protrusion    Motor:  Muscle bulk: normal, tone normal Mvmt Root Nerve  Muscle Right Left Comments  SA C5/6 Ax Deltoid 4+ 5   EF C5/6 Mc Biceps     EE C6/7/8 Rad Triceps     WF C6/7 Med FCR     WE C7/8 PIN ECU     F Ab C8/T1 U ADM/FDI 4 5 Having intermittent R hand rhythmic focal twitching  HF L1/2/3 Fem  Illopsoas 4 4   KE L2/3/4 Fem Quad 4+ 4+   DF L4/5 D Peron Tib Ant 5 5   PF S1/2 Tibial Grc/Sol 5 5    Sensation:  Light touch Intact throughout   Pin prick    Temperature    Vibration   Proprioception    Coordination/Complex Motor:  - Finger to Nose intact in LUE. Unable to do with RUE due to twitching - Heel to shin unable to do 2/2 body habitus - Rapid alternating movement are slowed. - Gait: deferred.  Labs/Imaging/Neurodiagnostic studies   CBC:  Recent Labs  Lab 07-27-24 0057 07-27-24 0102  WBC 8.9  --   NEUTROABS 7.3  --   HGB 18.2* 18.4*  HCT 54.4* 54.0*  MCV 94.1  --   PLT 200  --    Basic Metabolic Panel:  Lab Results  Component Value Date   NA 140 27-Jul-2024   K 5.4 (H) 07-27-24   CO2 21 (L) 07/10/2022   GLUCOSE >700 (HH) 07-27-24   BUN 52 (H) July 27, 2024   CREATININE 2.80 (H) 27-Jul-2024   CALCIUM  8.9 07/10/2022   GFRNONAA >60 07/10/2022   Lipid Panel: No results found for: LDLCALC HgbA1c:  Lab Results  Component Value Date   HGBA1C 6.0 (H) 07/09/2022   Urine Drug Screen: No results found for: LABOPIA, COCAINSCRNUR, LABBENZ, AMPHETMU, THCU, LABBARB  Alcohol Level No results found for: ETH INR No results found for: INR APTT No results found for: APTT AED levels: No results found for: PHENYTOIN, ZONISAMIDE, LAMOTRIGINE, LEVETIRACETA  CT Head without contrast(Personally reviewed): CTH was negative for a large hypodensity concerning for a large territory infarct or hyperdensity concerning for an ICH  CT angio Head and Neck with contrast(Personally reviewed): No LVO  MRI Brain(Personally reviewed): Pending  ASSESSMENT   Michelle Henderson is a 68 y.o. female with hx of asthma, DM2, HTN, obesity who was found in her car in the parking lot of an elementary school. Noted to have aphasia slightly out of proportion to confusion along with mild RUE weakness and R hand intermittent twitching concerning clinically for a  focal left hemispheric seizures.  Etiology of seizure I suspect is likely hyperglycemia.  CTH and CTA with no ICH, no LVO or significant stenosis. Low overall clinical suspicion for stroke.  She was hypertensive for EMS initially but her BP came down  spontaneously.  RECOMMENDATIONS  - Keppra  2500mg  IV once. Keppra  500mg  BID for a couple days while treating hyperglycemia. - MRI Brain w/o contrast. - rEEG in AM - treatment of hyperglycemia per primary team. ______________________________________________________________________  This patient is critically ill and at significant risk of neurological worsening, death and care requires constant monitoring of vital signs, hemodynamics,respiratory and cardiac monitoring, neurological assessment, discussion with family, other specialists and medical decision making of high complexity. I spent 50 minutes of neurocritical care time  in the care of  this patient. This was time spent independent of any time provided by nurse practitioner or PA.  Michelle Henderson [facility] 07/01/2024  2:59 AM    Signed, Naythan Douthit, MD [facility]     [1]  Allergies Allergen Reactions   Penicillins   [2]  Current Facility-Administered Medications:    levETIRAcetam  (KEPPRA ) undiluted injection 2,500 mg, 2,500 mg, Intravenous, Once, Casanova Schurman, MD, 2,500 mg at 07/01/24 0114   sodium chloride  flush (NS) 0.9 % injection 3 mL, 3 mL, Intravenous, Once, Rogelia Jerilynn RAMAN, MD  Current Outpatient Medications:    albuterol  (PROVENTIL  HFA;VENTOLIN  HFA) 108 (90 BASE) MCG/ACT inhaler, Inhale 2 puffs into the lungs every 6 (six) hours as needed for shortness of breath or wheezing., Disp: , Rfl:    atorvastatin  (LIPITOR) 20 MG tablet, Take 20 mg by mouth every other day., Disp: , Rfl:    Calcium  250 MG CAPS, Take 1 tablet by mouth 3 (three) times daily., Disp: , Rfl:    fexofenadine (ALLEGRA) 180 MG tablet, Take 180 mg by mouth  daily., Disp: , Rfl:    fluticasone  (FLONASE ) 50 MCG/ACT nasal spray, Place 2 sprays into both nostrils daily., Disp: , Rfl:    glucosamine-chondroitin 500-400 MG tablet, Take 2 tablets by mouth daily., Disp: , Rfl:    hydrochlorothiazide (HYDRODIURIL) 25 MG tablet, Take 25 mg by mouth daily., Disp: , Rfl:    lisinopril (ZESTRIL) 40 MG tablet, Take 40 mg by mouth daily., Disp: , Rfl:    metFORMIN (GLUCOPHAGE-XR) 500 MG 24 hr tablet, Take 500 mg by mouth 2 (two) times daily., Disp: , Rfl:    Multiple Vitamins-Minerals (CENTRUM SILVER 50+WOMEN PO), Take 1 tablet by mouth daily., Disp: , Rfl:    Multiple Vitamins-Minerals (PRESERVISION AREDS 2 PO), Take 2 tablets by mouth daily., Disp: , Rfl:    OZEMPIC, 1 MG/DOSE, 4 MG/3ML SOPN, Inject 1 mg into the skin once a week., Disp: , Rfl:    Probiotic Product (ACIDOPHILUS/GOAT MILK) CAPS, Take 1 capsule by mouth daily., Disp: , Rfl:

## 2024-07-01 NOTE — H&P (Addendum)
 History and Physical    Michelle Henderson FMW:[id number] DOB: 1956/06/16 DOA: 07/01/2024  Patient coming from: Home.  Chief Complaint: Confusion.  HPI: Michelle Henderson is a 68 y.o. female with history of diabetes mellitus type 2, hypertension, hyperlipidemia, chronic kidney disease stage III was brought to the ER by EMS after patient was found sitting in her running car at around 1130 last night.  EMS on arrival found that patient was confused and had some right arm deficit concerning for stroke.  Patient is confused not able to provide much history.  I discussed with patient's cousin Michelle Henderson the only contact number available in epic.  Michelle Henderson says that the patient and her had traveled to [facility] 2 weeks ago and had come back a week ago.  Ms. Henderson lives in [place] and did not know what happened last few days.  ED Course: In the ER patient was evaluated by neurologist and felt that patient had some shaking spells in the right upper extremity concerning for seizures.  Was loaded with Keppra .  CT angio head and neck did not show any large vessel obstruction.  Labs are significant for blood glucose of 1187 and anion gap of 31.  Beta-hydroxybutyrate acid is more than 8.  WBC count is 8.9 hemoglobin 18.2 creatinine 3.3 bicarb of 10.  Potassium 5.5.  Urine ketones positive.  EKG shows sinus tachycardia.  Patient admitted for DKA with acute encephalopathy and possible seizures.  On exam patient appears confused able to tell her name not able to give much details about why she was sitting in a running car in the parking lot at an elementary school.  Per report EMS initially noted her systolic blood pressure more than 200 at the site but in the ER has been in the low 100s.  Review of Systems: As per HPI, rest all negative.   Past Medical History:  Diagnosis Date   Asthma    Diabetes mellitus without complication (HCC)    Hypertension    Obesity     Past Surgical  History:  Procedure Laterality Date   TONSILLECTOMY  1960     reports that she has never smoked. She has never used smokeless tobacco. She reports current alcohol use. She reports that she does not use drugs.  Allergies[1]  Family History  Problem Relation Age of Onset   Breast cancer Neg Hx     Prior to Admission medications  Medication Sig Start Date End Date Taking? Authorizing Provider  albuterol  (PROVENTIL  HFA;VENTOLIN  HFA) 108 (90 BASE) MCG/ACT inhaler Inhale 2 puffs into the lungs every 6 (six) hours as needed for shortness of breath or wheezing. 01/11/15   [provider]  atorvastatin  (LIPITOR) 20 MG tablet Take 20 mg by mouth every other day. 04/29/22   [provider]  Calcium  250 MG CAPS Take 1 tablet by mouth 3 (three) times daily.    [provider]  fexofenadine (ALLEGRA) 180 MG tablet Take 180 mg by mouth daily.    [provider]  fluticasone  (FLONASE ) 50 MCG/ACT nasal spray Place 2 sprays into both nostrils daily. 06/14/22   [provider]  glucosamine-chondroitin 500-400 MG tablet Take 2 tablets by mouth daily.    [provider]  hydrochlorothiazide (HYDRODIURIL) 25 MG tablet Take 25 mg by mouth daily. 02/15/15   [provider]  lisinopril (ZESTRIL) 40 MG tablet Take 40 mg by mouth daily. 02/15/15   [provider]  metFORMIN (GLUCOPHAGE-XR) 500  MG 24 hr tablet Take 500 mg by mouth 2 (two) times daily. 02/16/15   [provider]  Multiple Vitamins-Minerals (CENTRUM SILVER 50+WOMEN PO) Take 1 tablet by mouth daily.    [provider]  Multiple Vitamins-Minerals (PRESERVISION AREDS 2 PO) Take 2 tablets by mouth daily.    [provider]  OZEMPIC, 1 MG/DOSE, 4 MG/3ML SOPN Inject 1 mg into the skin once a week. 07/07/22   [provider]  Probiotic Product (ACIDOPHILUS/GOAT MILK) CAPS Take 1 capsule by mouth daily.    [provider]    Physical  Exam: Constitutional: Moderately built and nourished. Vitals:   07/01/24 0056 07/01/24 0218 07/01/24 0315  BP: (!) 142/100  109/70  Pulse: (!) 113  (!) 103  Resp: 12  (!) 24  Temp:  (!) 97.5 F (36.4 C)   TempSrc:  Oral   SpO2: 100%  100%   Eyes: Anicteric no pallor. ENMT: No discharge from the ears eyes nose or mouth. Neck: No mass felt.  No neck rigidity. Respiratory: No rhonchi or crepitations. Cardiovascular: S1-S2 heard. Abdomen: Soft nontender bowel sound present. Musculoskeletal: No edema. Skin: No rash. Neurologic: Patient is confused oriented to name following commands moving extremities weak on the right upper extremity about 4 x 5.  Pupils equal and reacting to light. Psychiatric: Appears confused.   Labs on Admission: I have personally reviewed following labs and imaging studies  CBC: Recent Labs  Lab 07/01/24 0057 07/01/24 0102 07/01/24 0258  WBC 8.9  --   --   NEUTROABS 7.3  --   --   HGB 18.2* 18.4* 15.6*  HCT 54.4* 54.0* 46.0  MCV 94.1  --   --   PLT 200  --   --    Basic Metabolic Panel: Recent Labs  Lab 07/01/24 0057 07/01/24 0102 07/01/24 0224 07/01/24 0258  NA 139 140 140 138  K 5.5* 5.4* 5.1 5.2*  CL 98 105 101  --   CO2 10*  --  14*  --   GLUCOSE 1,187* >700* 1,173*  --   BUN 57* 52* 55*  --   CREATININE 3.35* 2.80* 3.24*  --   CALCIUM  9.0  --  8.9  --    GFR: CrCl cannot be calculated (Unknown ideal weight.). Liver Function Tests: Recent Labs  Lab 07/01/24 0057  AST 44*  ALT 90*  ALKPHOS 165*  BILITOT 2.4*  PROT 7.3  ALBUMIN 3.7   No results for input(s): LIPASE, AMYLASE in the last 168 hours. No results for input(s): AMMONIA in the last 168 hours. Coagulation Profile: Recent Labs  Lab 07/01/24 0057  INR 1.2   Cardiac Enzymes: No results for input(s): CKTOTAL, CKMB, CKMBINDEX, TROPONINI in the last 168 hours. BNP (last 3 results) No results for input(s): PROBNP in the last 8760 hours. HbA1C: No  results for input(s): HGBA1C in the last 72 hours. CBG: Recent Labs  Lab 07/01/24 0047 07/01/24 0227  GLUCAP >600* >600*   Lipid Profile: No results for input(s): CHOL, HDL, LDLCALC, TRIG, CHOLHDL, LDLDIRECT in the last 72 hours. Thyroid  Function Tests: No results for input(s): TSH, T4TOTAL, FREET4, T3FREE, THYROIDAB in the last 72 hours. Anemia Panel: No results for input(s): VITAMINB12, FOLATE, FERRITIN, TIBC, IRON, RETICCTPCT in the last 72 hours. Urine analysis:    Component Value Date/Time   COLORURINE YELLOW 07/01/2024 0224   APPEARANCEUR HAZY (A) 07/01/2024 0224   LABSPEC 1.027 07/01/2024 0224   PHURINE 5.0 07/01/2024 0224   GLUCOSEU >=500 (  A) 07/01/2024 0224   HGBUR SMALL (A) 07/01/2024 0224   BILIRUBINUR NEGATIVE 07/01/2024 0224   KETONESUR 20 (A) 07/01/2024 0224   PROTEINUR 30 (A) 07/01/2024 0224   NITRITE NEGATIVE 07/01/2024 0224   LEUKOCYTESUR TRACE (A) 07/01/2024 0224   Sepsis Labs: @LABRCNTIP (procalcitonin:4,lacticidven:4) )No results found for this or any previous visit (from the past 240 hours).   Radiological Exams on Admission: CT ANGIO HEAD NECK W WO CM (CODE STROKE) Result Date: 07/01/2024 EXAM: CTA Head and Neck with Intravenous Contrast. CT Head without Contrast. CLINICAL HISTORY: Neuro deficit, acute, stroke suspected. TECHNIQUE: Axial CTA images of the head and neck performed with intravenous contrast. MIP reconstructed images were created and reviewed. Axial computed tomography images of the head/brain performed without intravenous contrast. Note: Per PQRS, the description of internal carotid artery percent stenosis, including 0 percent or normal exam, is based on [employer] American Symptomatic Carotid Endarterectomy Trial (NASCET) criteria. Dose reduction technique was used including one or more of the following: automated exposure control, adjustment of mA and kV according to patient size, and/or iterative reconstruction.  CONTRAST: With; COMPARISON: CT from 07/01/2024. FINDINGS: CT HEAD: BRAIN: No acute intraparenchymal hemorrhage. No mass lesion. No CT evidence for acute territorial infarct. No midline shift or extra-axial collection. VENTRICLES: No hydrocephalus. ORBITS: The orbits are unremarkable. SINUSES AND MASTOIDS: The paranasal sinuses and mastoid air cells are clear. CTA NECK: COMMON CAROTID ARTERIES: Mild atheromatous change about the right carotid bulb without stenosis. No significant stenosis of the left common carotid artery. No dissection or occlusion. INTERNAL CAROTID ARTERIES: No stenosis by NASCET criteria. No dissection or occlusion. VERTEBRAL ARTERIES: No significant stenosis. No dissection or occlusion. CTA HEAD: ANTERIOR CEREBRAL ARTERIES: No significant stenosis. No occlusion. No aneurysm. MIDDLE CEREBRAL ARTERIES: No significant stenosis. No occlusion. No aneurysm. POSTERIOR CEREBRAL ARTERIES: No significant stenosis. No occlusion. No aneurysm. BASILAR ARTERY: No significant stenosis. No occlusion. No aneurysm. OTHER: SOFT TISSUES: No acute finding. No masses or lymphadenopathy. BONES: Moderate multilevel cervical spondylosis, most pronounced at C7-T1. No acute osseous abnormality of the visualized portions of the skull. IMPRESSION: 1. Negative CTA for acute . LVO or other abnormality. 2. Findings communicated by text page to Dr. Vanessa at 1:13 am on 07/01/2024. Electronically signed by: Morene Hoard MD 07/01/2024 01:17 AM EST RP Workstation: [id number]   CT HEAD CODE STROKE WO CONTRAST Result Date: 07/01/2024 EXAM: CT HEAD WITHOUT CONTRAST 07/01/2024 12:57:46 AM TECHNIQUE: CT of the head was performed without the administration of intravenous contrast. Automated exposure control, iterative reconstruction, and/or weight based adjustment of the mA/kV was utilized to reduce the radiation dose to as low as reasonably achievable. COMPARISON: None available. CLINICAL HISTORY: Neuro deficit, acute,  stroke suspected Neuro deficit, acute, stroke suspected FINDINGS: BRAIN AND VENTRICLES: No acute hemorrhage. No evidence of acute infarct. No hydrocephalus. No extra-axial collection. No mass effect or midline shift. ORBITS: No acute abnormality. SINUSES: No acute abnormality. SOFT TISSUES AND SKULL: No acute soft tissue abnormality. No skull fracture. IMPRESSION: 1. No acute intracranial abnormality. 2. Aspects is 10. 3. Findings communicated by text page to Dr. Vanessa at 1:01 am on 07/01/2024. Electronically signed by: Morene Hoard MD 07/01/2024 01:02 AM EST RP Workstation: [id number]    EKG: Independently reviewed.  Sinus tachycardia.  Assessment/Plan Principal Problem:   DKA (diabetic ketoacidosis) (HCC) Active Problems:   Essential hypertension   Acute encephalopathy   ARF (acute renal failure)   HLD (hyperlipidemia)   Seizure (HCC)    Diabetic ketoacidosis in type 2 diabetes patient's  home medication list shows that patient is on Ozempic and metformin.  Patient started on DKA protocol on IV insulin  infusion.  Check hemoglobin A1c today closely follow metabolic panel.  Precipitating cause for DKA unclear. Acute encephalopathy likely multifactorial including DKA, possible seizures, acute renal failure.  MRI brain EEG is pending.  Keppra  loading dose was given to the patient by neurology for possible seizures. Possible seizures on Keppra  loading dose and 2 to 3 days more until blood glucose improves.  Per neurology seizure may be precipitated by patient's uncontrolled diabetes.  MRI brain EEG is pending. Acute renal failure with mild hyperkalemia could be from dehydration in the setting of DKA.  Hold lisinopril hydrochlorothiazide in setting of acute renal failure.  Hydrate and follow metabolic panel closely.  I think potassium will correct with correction of DKA. Erythrocytosis likely from dehydration follow CBC after hydration. History of hypertension takes ACE inhibitors and  hydrochlorothiazide presently on hold due to acute renal failure.  Follow blood pressure trends closely. Possible UTI on ceftriaxone  follow cultures. Hyperlipidemia takes statin.  Presently NPO.  Since patient has DKA with possible seizure and acute encephalopathy which will need further workup will need more than 2 midnight stay.  DVT prophylaxis: Heparin . Code Status: Full code. Family Communication: Patient's next of kin Barnie Henderson. Disposition Plan: Progressive care. Consults called: Neurology. Admission status: Inpatient.         [1]  Allergies Allergen Reactions   Penicillins

## 2024-07-02 ENCOUNTER — Other Ambulatory Visit (HOSPITAL_COMMUNITY): Payer: Self-pay

## 2024-07-02 ENCOUNTER — Telehealth (HOSPITAL_COMMUNITY): Payer: Self-pay | Admitting: Pharmacy Technician

## 2024-07-02 DIAGNOSIS — N179 Acute kidney failure, unspecified: Secondary | ICD-10-CM | POA: Diagnosis not present

## 2024-07-02 DIAGNOSIS — E1165 Type 2 diabetes mellitus with hyperglycemia: Secondary | ICD-10-CM | POA: Diagnosis not present

## 2024-07-02 DIAGNOSIS — R569 Unspecified convulsions: Secondary | ICD-10-CM | POA: Diagnosis not present

## 2024-07-02 LAB — BASIC METABOLIC PANEL WITH GFR
Anion gap: 10 (ref 5–15)
Anion gap: 13 (ref 5–15)
Anion gap: 14 (ref 5–15)
Anion gap: 14 (ref 5–15)
Anion gap: 15 (ref 5–15)
Anion gap: 19 — ABNORMAL HIGH (ref 5–15)
BUN: 41 mg/dL — ABNORMAL HIGH (ref 8–23)
BUN: 42 mg/dL — ABNORMAL HIGH (ref 8–23)
BUN: 44 mg/dL — ABNORMAL HIGH (ref 8–23)
BUN: 46 mg/dL — ABNORMAL HIGH (ref 8–23)
BUN: 46 mg/dL — ABNORMAL HIGH (ref 8–23)
BUN: 47 mg/dL — ABNORMAL HIGH (ref 8–23)
CO2: 19 mmol/L — ABNORMAL LOW (ref 22–32)
CO2: 22 mmol/L (ref 22–32)
CO2: 22 mmol/L (ref 22–32)
CO2: 24 mmol/L (ref 22–32)
CO2: 25 mmol/L (ref 22–32)
CO2: 26 mmol/L (ref 22–32)
Calcium: 9 mg/dL (ref 8.9–10.3)
Calcium: 9.3 mg/dL (ref 8.9–10.3)
Calcium: 9.5 mg/dL (ref 8.9–10.3)
Calcium: 9.5 mg/dL (ref 8.9–10.3)
Calcium: 9.6 mg/dL (ref 8.9–10.3)
Calcium: 9.6 mg/dL (ref 8.9–10.3)
Chloride: 113 mmol/L — ABNORMAL HIGH (ref 98–111)
Chloride: 114 mmol/L — ABNORMAL HIGH (ref 98–111)
Chloride: 115 mmol/L — ABNORMAL HIGH (ref 98–111)
Chloride: 116 mmol/L — ABNORMAL HIGH (ref 98–111)
Chloride: 116 mmol/L — ABNORMAL HIGH (ref 98–111)
Chloride: 116 mmol/L — ABNORMAL HIGH (ref 98–111)
Creatinine, Ser: 1.55 mg/dL — ABNORMAL HIGH (ref 0.44–1.00)
Creatinine, Ser: 1.64 mg/dL — ABNORMAL HIGH (ref 0.44–1.00)
Creatinine, Ser: 1.67 mg/dL — ABNORMAL HIGH (ref 0.44–1.00)
Creatinine, Ser: 1.77 mg/dL — ABNORMAL HIGH (ref 0.44–1.00)
Creatinine, Ser: 1.78 mg/dL — ABNORMAL HIGH (ref 0.44–1.00)
Creatinine, Ser: 1.82 mg/dL — ABNORMAL HIGH (ref 0.44–1.00)
GFR, Estimated: 29 mL/min — ABNORMAL LOW (ref >60)
GFR, Estimated: 30 mL/min — ABNORMAL LOW (ref >60)
GFR, Estimated: 30 mL/min — ABNORMAL LOW (ref >60)
GFR, Estimated: 33 mL/min — ABNORMAL LOW (ref >60)
GFR, Estimated: 33 mL/min — ABNORMAL LOW (ref >60)
GFR, Estimated: 36 mL/min — ABNORMAL LOW (ref >60)
Glucose, Bld: 175 mg/dL — ABNORMAL HIGH (ref 70–99)
Glucose, Bld: 208 mg/dL — ABNORMAL HIGH (ref 70–99)
Glucose, Bld: 248 mg/dL — ABNORMAL HIGH (ref 70–99)
Glucose, Bld: 251 mg/dL — ABNORMAL HIGH (ref 70–99)
Glucose, Bld: 345 mg/dL — ABNORMAL HIGH (ref 70–99)
Glucose, Bld: 477 mg/dL — ABNORMAL HIGH (ref 70–99)
Potassium: 3.6 mmol/L (ref 3.5–5.1)
Potassium: 3.7 mmol/L (ref 3.5–5.1)
Potassium: 3.8 mmol/L (ref 3.5–5.1)
Potassium: 3.9 mmol/L (ref 3.5–5.1)
Potassium: 3.9 mmol/L (ref 3.5–5.1)
Potassium: 4 mmol/L (ref 3.5–5.1)
Sodium: 149 mmol/L — ABNORMAL HIGH (ref 135–145)
Sodium: 152 mmol/L — ABNORMAL HIGH (ref 135–145)
Sodium: 152 mmol/L — ABNORMAL HIGH (ref 135–145)
Sodium: 153 mmol/L — ABNORMAL HIGH (ref 135–145)
Sodium: 154 mmol/L — ABNORMAL HIGH (ref 135–145)
Sodium: 155 mmol/L — ABNORMAL HIGH (ref 135–145)

## 2024-07-02 LAB — BETA-HYDROXYBUTYRIC ACID
Beta-Hydroxybutyric Acid: 0.11 mmol/L (ref 0.05–0.27)
Beta-Hydroxybutyric Acid: 0.16 mmol/L (ref 0.05–0.27)
Beta-Hydroxybutyric Acid: 0.32 mmol/L — ABNORMAL HIGH (ref 0.05–0.27)
Beta-Hydroxybutyric Acid: 0.55 mmol/L — ABNORMAL HIGH (ref 0.05–0.27)
Beta-Hydroxybutyric Acid: 0.71 mmol/L — ABNORMAL HIGH (ref 0.05–0.27)
Beta-Hydroxybutyric Acid: 0.92 mmol/L — ABNORMAL HIGH (ref 0.05–0.27)

## 2024-07-02 LAB — TSH: TSH: 0.512 u[IU]/mL (ref 0.350–4.500)

## 2024-07-02 LAB — CBG MONITORING, ED
Glucose-Capillary: 100 mg/dL — ABNORMAL HIGH (ref 70–99)
Glucose-Capillary: 115 mg/dL — ABNORMAL HIGH (ref 70–99)
Glucose-Capillary: 234 mg/dL — ABNORMAL HIGH (ref 70–99)
Glucose-Capillary: 237 mg/dL — ABNORMAL HIGH (ref 70–99)
Glucose-Capillary: 240 mg/dL — ABNORMAL HIGH (ref 70–99)
Glucose-Capillary: 246 mg/dL — ABNORMAL HIGH (ref 70–99)

## 2024-07-02 LAB — GLUCOSE, CAPILLARY
Glucose-Capillary: 381 mg/dL — ABNORMAL HIGH (ref 70–99)
Glucose-Capillary: 171 mg/dL — ABNORMAL HIGH (ref 70–99)
Glucose-Capillary: 186 mg/dL — ABNORMAL HIGH (ref 70–99)

## 2024-07-02 LAB — OSMOLALITY: Osmolality: 346 mosm/kg (ref 275–295)

## 2024-07-02 MED ORDER — INSULIN ASPART 100 UNIT/ML IJ SOLN
0.0000 [IU] | Freq: Three times a day (TID) | INTRAMUSCULAR | Status: DC
  Administered 2024-07-02: 17:00:00 3 [IU] via SUBCUTANEOUS
  Administered 2024-07-02: 13:00:00 15 [IU] via SUBCUTANEOUS
  Administered 2024-07-02: 08:00:00 5 [IU] via SUBCUTANEOUS
  Administered 2024-07-03 (×3): 8 [IU] via SUBCUTANEOUS
  Administered 2024-07-04: 3 [IU] via SUBCUTANEOUS
  Administered 2024-07-04: 15 [IU] via SUBCUTANEOUS
  Administered 2024-07-04: 5 [IU] via SUBCUTANEOUS
  Administered 2024-07-05: 3 [IU] via SUBCUTANEOUS
  Administered 2024-07-05: 5 [IU] via SUBCUTANEOUS
  Administered 2024-07-05: 3 [IU] via SUBCUTANEOUS
  Administered 2024-07-06: 5 [IU] via SUBCUTANEOUS
  Administered 2024-07-06 – 2024-07-07 (×2): 3 [IU] via SUBCUTANEOUS
  Administered 2024-07-07: 5 [IU] via SUBCUTANEOUS
  Filled 2024-07-02 (×2): qty 3
  Filled 2024-07-02: qty 15
  Filled 2024-07-02: qty 8
  Filled 2024-07-02: qty 3
  Filled 2024-07-02: qty 8
  Filled 2024-07-02: qty 3
  Filled 2024-07-02: qty 5
  Filled 2024-07-02: qty 3
  Filled 2024-07-02 (×2): qty 5
  Filled 2024-07-02: qty 3
  Filled 2024-07-02: qty 5
  Filled 2024-07-02: qty 8
  Filled 2024-07-02: qty 15

## 2024-07-02 MED ORDER — LACTATED RINGERS IV SOLN: INTRAVENOUS | Status: DC

## 2024-07-02 MED ORDER — SODIUM CHLORIDE 0.45 % IV SOLN: INTRAVENOUS | Status: AC

## 2024-07-02 MED ORDER — INSULIN ASPART 100 UNIT/ML IJ SOLN
0.0000 [IU] | Freq: Every day | INTRAMUSCULAR | Status: DC
  Administered 2024-07-03: 21:00:00 3 [IU] via SUBCUTANEOUS
  Administered 2024-07-04: 2 [IU] via SUBCUTANEOUS
  Filled 2024-07-02: qty 3
  Filled 2024-07-02: qty 2

## 2024-07-02 MED ORDER — INSULIN GLARGINE 100 UNIT/ML ~~LOC~~ SOLN
20.0000 [IU] | Freq: Every day | SUBCUTANEOUS | Status: DC
  Filled 2024-07-02: qty 0.2

## 2024-07-02 MED ORDER — INSULIN GLARGINE 100 UNIT/ML ~~LOC~~ SOLN
15.0000 [IU] | Freq: Every day | SUBCUTANEOUS | Status: DC
  Administered 2024-07-02: 05:00:00 15 [IU] via SUBCUTANEOUS
  Filled 2024-07-02: qty 0.15

## 2024-07-02 MED ORDER — INSULIN ASPART 100 UNIT/ML IJ SOLN
4.0000 [IU] | Freq: Three times a day (TID) | INTRAMUSCULAR | Status: DC
  Administered 2024-07-02 – 2024-07-03 (×5): 4 [IU] via SUBCUTANEOUS
  Filled 2024-07-02 (×5): qty 4

## 2024-07-02 MED ORDER — INSULIN GLARGINE 100 UNIT/ML ~~LOC~~ SOLN
5.0000 [IU] | Freq: Once | SUBCUTANEOUS | Status: AC
  Administered 2024-07-02: 15:00:00 5 [IU] via SUBCUTANEOUS
  Filled 2024-07-02: qty 0.05

## 2024-07-02 NOTE — Progress Notes (Signed)
 [facility] night cross cover note:   Regarding this patient who was admitted for DKA and started on insulin  drip at the time of admission, I was contacted by the patient's RN with updates regarding the patient's most recent labs and overall trend in labs.   Her most recent BMP drawn around 2345, the patient's anion gap remains closed, most recently 14, with previous value of 15.  Relative to initial anion gap of 31.  Bicarb remains within normal limits at 22. This is in addition to most recent beta-hydroxybutyrate acid level of  0.32, which which is continue to trend down from initial beta-hydroxybutyrate acid level of greater than 8.0.  Most recent CBG noted to be 115, and the patient is reported to be tolerating p.o. at this time.  Additionally,  patient's renal function continues to improve, with most recent creatinine 1.82 compared to initial creatinine of 3.35. Patient continues to have limited urine output, with RN conveying approximately 250 cc's out over night shift so far, with recent bladder scan showing no urine in bladder, potentially suggesting atn. Will continue to closely monitor in setting urine output.   Patient's serum sodium level, adjusted for concomitant hyperglycemia, remains elevated. The trend in these values since presentation is noted to be as follows: 157 --> 158 --> 158 --> 158 --> 157. Felt to be very dry, with differential that also includes central DI in setting of initial suspected seizures.  Currently on D5LR at 125 cc/hr.   Will initiate the transition from IV insulin  to subcutaneous insulin  order set at this time, noting that diabetic educator recommended initiation of 15 units of Semglee  at the time of initiating transition off of insulin  drip.   Correspondingly, I have ordered 15 units SQ daily, first dose now (which will occur at  0330).  Following initiation of this first dose of subcutaneous insulin , will continue insulin  drip for another 2 hours during which time  will continue existing D5 LR at 125 cc/h in order to prevent hypoglycemia given concomitant continuation of insulin  drip over this timeframe.  I have modified the D5 LR order to reflect a stop Time of  0530 AM, corresponding with anticipated time of discontinuation of insulin  drip.  Additional order placed for initiation of LR 125 cc/hr to start at 0530.   I have also ordered CBG to be checked at the time of discontinuation of the insulin  drip, with associated orders for corrective sliding scale insulin  based upon this CBG value.  Subsequent to that, I have initiated ensuing CBG monitoring in the form of QAC/HS CBG checks with associated sliding scale insulin  coverage.   Once off of insulin  drip, anticipate calculation of total body water deficit with transition of ivf's to D5W in setting of patient's hypernatremia. I've also ordered random urine Na, random urine Cr, random urine osmolality, and serum osmolality. Existing order for TSH level is noted.     Eva Pore, DO Hospitalist

## 2024-07-02 NOTE — Evaluation (Signed)
 Physical Therapy Evaluation Patient Details Name: Michelle Henderson MRN: [id number] DOB: 07/07/1956 Today's Date: 07/02/2024  History of Present Illness  Pt is 68 year old presented to [facility] on  07/01/24 for aphasia and mild RUE weaknss and rt hand intermittent twitching. Pt with DKA possible seizure and acute encephalopathy.  PMH - DM, HTN, obesity  Clinical Impression  Pt admitted with above diagnosis and presents to PT with functional limitations due to deficits listed below (See PT problem list). Pt needs skilled PT to maximize independence and safety. Pt able to amb with assist in hallway and she feels the stiffness from being on stretcher/bed for a day is impairing her mobility some. Likely she isn't too far from baseline and hopefully can return home with [facility]. Her problem solving and processing seems slow so will see what OT thinks on their assessment.           If plan is discharge home, recommend the following: Assistance with cooking/housework;Help with stairs or ramp for entrance;Assist for transportation   Can travel by private vehicle        Equipment Recommendations None recommended by PT  Recommendations for Other Services       Functional Status Assessment Patient has had a recent decline in their functional status and demonstrates the ability to make significant improvements in function in a reasonable and predictable amount of time.     Precautions / Restrictions Precautions Precautions: Fall Recall of Precautions/Restrictions: Impaired Restrictions Weight Bearing Restrictions Per Provider Order: No      Mobility  Bed Mobility Overal bed mobility: Needs Assistance Bed Mobility: Supine to Sit     Supine to sit: Min assist     General bed mobility comments: assist to pull trunk into sitti    Transfers Overall transfer level: Needs assistance Equipment used: Rolling walker (2 wheels) Transfers: Sit to/from Stand Sit to Stand: Min assist, Contact  guard assist           General transfer comment: assist to power up from low commode. CGA and incr time to stand from bed    Ambulation/Gait Ambulation/Gait assistance: Contact guard assist Gait Distance (Feet): 120 Feet Assistive device: Rolling walker (2 wheels), None Gait Pattern/deviations: Step-through pattern, Decreased stride length (stiffed leg gait) Gait velocity: decr Gait velocity interpretation: 1.31 - 2.62 ft/sec, indicative of limited community ambulator   General Gait Details: Assist for safety. Moved better with walker than without  Stairs            Wheelchair Mobility     Tilt Bed    Modified Rankin (Stroke Patients Only)       Balance Overall balance assessment: Needs assistance Sitting-balance support: No upper extremity supported, Feet supported Sitting balance-Leahy Scale: Good     Standing balance support: No upper extremity supported, During functional activity Standing balance-Leahy Scale: Fair                               Pertinent Vitals/Pain Pain Assessment Pain Assessment: No/denies pain    Home Living Family/patient expects to be discharged to:: Private residence Living Arrangements: Alone   Type of Home: House Home Access: Level entry       Home Layout: One level Home Equipment: Rollator (4 wheels)      Prior Function Prior Level of Function : Independent/Modified Independent;Driving             Mobility Comments: does not use assistive  device       Extremity/Trunk Assessment   Upper Extremity Assessment Upper Extremity Assessment: Defer to OT evaluation    Lower Extremity Assessment Lower Extremity Assessment: Generalized weakness       Communication   Communication Communication: No apparent difficulties    Cognition Arousal: Alert Behavior During Therapy: WFL for tasks assessed/performed   PT - Cognitive impairments: Problem solving, Safety/Judgement, Awareness                          Following commands: Impaired Following commands impaired: Follows multi-step commands with increased time     Cueing Cueing Techniques: Verbal cues     General Comments General comments (skin integrity, edema, etc.): RHR 100, HR with amb 130    Exercises     Assessment/Plan    PT Assessment Patient needs continued PT services  PT Problem List Decreased strength;Decreased balance;Decreased mobility;Decreased cognition;Decreased safety awareness;Obesity       PT Treatment Interventions DME instruction;Gait training;Functional mobility training;Therapeutic activities;Balance training;Therapeutic exercise;Patient/family education    PT Goals (Current goals can be found in the Care Plan section)  Acute Rehab PT Goals Patient Stated Goal: return home PT Goal Formulation: With patient Time For Goal Achievement: 07/16/24 Potential to Achieve Goals: Good    Frequency Min 2X/week     Co-evaluation               AM-PAC PT 6 Clicks Mobility  Outcome Measure Help needed turning from your back to your side while in a flat bed without using bedrails?: A Little Help needed moving from lying on your back to sitting on the side of a flat bed without using bedrails?: A Little Help needed moving to and from a bed to a chair (including a wheelchair)?: A Little Help needed standing up from a chair using your arms (e.g., wheelchair or bedside chair)?: A Little Help needed to walk in hospital room?: A Little Help needed climbing 3-5 steps with a railing? : A Little 6 Click Score: 18    End of Session   Activity Tolerance: Patient tolerated treatment well Patient left: in chair;with call bell/phone within reach;with chair alarm set Nurse Communication: Mobility status PT Visit Diagnosis: Unsteadiness on feet (R26.81);Other abnormalities of gait and mobility (R26.89);Muscle weakness (generalized) (M62.81)    Time: 8494-8463 PT Time Calculation (min) (ACUTE  ONLY): 31 min   Charges:   PT Evaluation $PT Eval Moderate Complexity: 1 Mod PT Treatments $Gait Training: 8-22 mins PT General Charges $$ ACUTE PT VISIT: 1 Visit         [facility] PT Acute Rehabilitation Services Office [phone]   Rodgers ORN [facility] 07/02/2024, 4:20 PM

## 2024-07-02 NOTE — Telephone Encounter (Signed)
 Pharmacy Patient Advocate Encounter   Received notification from Inpatient Request that prior authorization for FreeStyle Libre 3 Plus Sensor  is required/requested.   Insurance verification completed.   The patient is insured through [employer].   Per test claim: PA required; PA submitted to above mentioned insurance via Latent Key/confirmation #/[id number] Status is pending

## 2024-07-02 NOTE — ED Notes (Signed)
 Bladder scanned pt found 250 ml in bladder

## 2024-07-02 NOTE — Inpatient Diabetes Management (Addendum)
 Inpatient Diabetes Program Recommendations  AACE/ADA: New Consensus Statement on Inpatient Glycemic Control (2015)  Target Ranges:  Prepandial:   less than 140 mg/dL      Peak postprandial:   less than 180 mg/dL (1-2 hours)      Critically ill patients:  140 - 180 mg/dL    Latest Reference Range & Units 07/01/24 00:57  Glucose 70 - 99 mg/dL 8,812 (HH)  (HH): Data is critically high  Latest Reference Range & Units 07/01/24 05:07  Hemoglobin A1C 4.8 - 5.6 % 16.7 (H)  432 mg/dl  (H): Data is abnormally high  Latest Reference Range & Units 07/01/24 02:24 07/01/24 05:07 07/01/24 10:58 07/01/24 18:48 07/01/24 23:41  Beta-Hydroxybutyric Acid 0.05 - 0.27 mmol/L >8.00 (H) 7.82 (H) 1.37 (H) 1.34 (H) 0.32 (H)  (H): Data is abnormally high  Latest Reference Range & Units 07/01/24 20:53 07/01/24 21:47 07/01/24 23:00 07/01/24 23:54 07/02/24 00:56 07/02/24 01:59 07/02/24 03:35 07/02/24 04:34 07/02/24 05:48 07/02/24 06:52  Glucose-Capillary 70 - 99 mg/dL 701 (H) 777 (H) 838 (H) 388 (H) 100 (H) 115 (H) 246 (H) 237 (H) 240 (H) 234 (H)  IV Insulin  Drip Running  (H): Data is abnormally high   Admit with: DKA  History: DM2  Home DM Meds: Metformin 500 mg BID       Ozempic 2 mg Qweek  Current Orders: IV Insulin  Drip   PCP: Dr. Erminio Sensing with [facility] Last Seen 12/07/2023 A1c at that visit was 6.4%   MD- Note BHB level at 23:41 was down to 0.32 Last Anion Gap was 14/ CO2 22  When CBGs are more stable and transition to SQ Insulin  started, please consider for transition: Semglee  15 units daily (0.15 units/kg) Novolog  Sensitive Correction Scale/ SSI (0-9 units) TID AC + HS Novolog  4 units TID with meals (if allowed PO diet) HOLD if pt NPO HOLD if pt eats <50% meals   Addendum 11:45am--Met w/ pt and her [employer].  Pt unmarried and does not have any children--lives alone.  Pt was alert and answered all my questions, but upon private conversation with  her cousin, her mental status is NOT back to baseline yet.  Per the cousin, pt's car is packed with things and they are not allowed to go in her house.  Cousin stated to me that pt's health has appeared to deteriorate over the year and her mobility is limited.  Cousin unsure what pt's home is like on the inside b/c pt will not allow family in her home.  Pt told me she does not have a CBG meter at home b/c her PCP never told her she needed to check her CBGs.  Stopped taking her Ozempic and Metformin over 2 weeks ago (did not say why she stopped her meds) and cousin told me in private that she suspects pt stopped meds more than 2 weeks ago.  I confirmed with pt that she last saw her PCP in May 2025 and has not seen the PCP since then--Pt stated that her PCP always checks a CBG when she goes to see the PCP.  I reviewed with pt and her cousin that her last A1c in May showed good control (was 6.4%) but now her A1c is 16.7%.  Explained what an A1c is and what it measures and discussed with pt and cousin that she may need insulin  for home given her A1c was so extremely elevated.  Pt stated she would take insulin  if she needs to,  however, I am unsure if pt was truly comprehending and understanding everything I was saying.  Pt and cousin told me pt gets a lot of take out food and does not always eat healthfully.  I discussed with pt and cousin how we transitioned her to SQ Insulin  this AM.  Dicussed with pt and cousin that pt will need to check CBGs when she goes home.  Pt's cousin told me in private that the MD mentioned the possibility of Rehab after d/c.  Cousin in agreement with Rehab but she is concerned that pt may not be agreeable.  Discussed with pt's cousin that I will come back to see pt again tomorrow 12/18 to see if pt's mental status back to baseline to further discuss possibility of using insulin  at home.      --Will follow patient during hospitalization--  Adina Rudolpho Arrow RN, MSN,  CDCES Diabetes Coordinator Inpatient Glycemic Control Team Team Pager: [phone] (8a-5p)

## 2024-07-02 NOTE — Telephone Encounter (Signed)
 Pharmacy Patient Advocate Encounter  Received notification from [employer] that Prior Authorization for FreeStyle Libre 3 Plus Sensor  has been APPROVED from 07/02/2024 to 07/16/2025. Ran test claim, Copay is $0.00. This test claim was processed through [facility]- copay amounts may vary at other pharmacies due to pharmacy/plan contracts, or as the patient moves through the different stages of their insurance plan.   PA #/Case ID/Reference #: [id number]

## 2024-07-02 NOTE — Progress Notes (Signed)
 Speech Language Pathology Treatment: Dysphagia  Patient Details Name: Michelle Henderson MRN: [id number] DOB: 1956-05-27 Today's Date: 07/02/2024 Time: 9084-9072 SLP Time Calculation (min) (ACUTE ONLY): 12 min  Assessment / Plan / Recommendation Clinical Impression  Pt is much more alert today and self-fed independently. Oral transit was prompt and complete, independently followed by a liquid wash. No s/s of aspiration were observed with consecutive sips of thin liquids via straw. Recommend intermittent supervision with meals to provide set up assistance and close monitoring with meds given oral holding noted previous date. Will advance diet to regular and sign off.   HPI HPI: 68 yo female presenting to ED 12/16 after being found with AMS and RUE weakness in her car. CTH and CTA negative. MRI negative. EEG suggestive of encephalopathy without seizures. PMH includes asthma, T2DM, HTN      SLP Plan  All goals met        Swallow Evaluation Recommendations   Recommendations: PO diet PO Diet Recommendation: Regular;Thin liquids (Level 0) Liquid Administration via: Cup;Straw Medication Administration: Whole meds with liquid Supervision: Staff to assist with self-feeding;Intermittent supervision/cueing for swallowing strategies Postural changes: Position pt fully upright for meals Oral care recommendations: Oral care BID (2x/day)     Recommendations                     Oral care BID   PRN Dysphagia, unspecified (R13.10)     All goals met     Damien Blumenthal, M.A., CCC-SLP Speech Language Pathology, Acute Rehabilitation Services  Secure Chat preferred [phone]   07/02/2024, 11:15 AM

## 2024-07-02 NOTE — ED Notes (Addendum)
 Md made aware of sodium changes, bladder volume, anuria and insulin  drip status.

## 2024-07-02 NOTE — Progress Notes (Signed)
 NEUROLOGY CONSULT FOLLOW UP NOTE   Date of service: July 02, 2024 Patient Name: Michelle Henderson MRN:  [id number] DOB:  1956-06-23  Interval Hx/subjective  Improvement in blood glucose levels and level of alertness overnight.  No further right upper extremity deficits noted on exam. AKI with improvement overnight as well Cr. 3.35 -> 1.82 -> 1.64 EEG without seizures or epileptiform discharges.  MRI negative for acute intracranial abnormality  Patient remains hypernatremic in the setting of severe dehydration.  She has been getting aggressive IVF replacement but remains to have minimal urine output. She also is on ceftriaxone  for a possible UTI.  Vitals   Vitals:   07/02/24 0600 07/02/24 0830 07/02/24 1030 07/02/24 1318  BP: 132/84 (!) 145/73 133/83 134/80  Pulse: 90 99 96 98  Resp: (!) 21 20 19 20   Temp:    97.7 F (36.5 C)  TempSrc:    Oral  SpO2: 97% 99% 98% 98%    There is no height or weight on file to calculate BMI.  Physical Exam   Constitutional: Appears well-developed and well-nourished.  Psych: Affect appropriate to situation. She is calm and cooperative with exam.  Eyes: No scleral injection.  HENT: No OP obstrucion.  Head: Normocephalic.  Cardiovascular: Normal rate and regular rhythm on bedside monitor Respiratory: Effort normal, non-labored breathing on room air GI: Soft.  No distension. There is no tenderness.  Skin: WDI.   Neurologic Examination   Mental Status: Patient is awake, alert, oriented to self, place, but has minimal recollection to events preceding hospitalization.  She quickly states her name and age correctly as well as identifies that she is in the hospital but she struggles to recall the month.  She initially states that it is the jingle bell month but has delayed recall to identifying that it is December.  She initially states the year is 2022 but quickly corrects to 2025.  She does not have aphasia or dysarthria.  Cranial  Nerves: II: Visual Fields are full. Pupils are equal, round, and reactive to light.   III,IV, VI: EOMI without ptosis or diploplia.  V: Facial sensation is intact and symmetric to light touch VII: Face is symmetric resting and with movement VIII: Hearing is intact to voice X: Palate elevates symmetrically XI: Shoulder shrug is symmetric. XII: Tongue protrudes midline  Motor: Tone is normal. Bulk is normal. 5/5 strength was present in all four extremities without asymmetry or unilateral weakness Sensory: Sensation is symmetric to light touch and temperature in the arms and legs. No extinction to DSS present.  Cerebellar: FNF intact bilaterally without ataxia  Medications Current Medications[1]  Labs and Diagnostic Imaging  CBC:  Recent Labs  Lab 07/01/24 0057 07/01/24 0102 07/01/24 0258 07/01/24 0507  WBC 8.9  --   --  8.5  NEUTROABS 7.3  --   --   --   HGB 18.2*   < > 15.6* 16.1*  HCT 54.4*   < > 46.0 46.7*  MCV 94.1  --   --  90.9  PLT 200  --   --  183   < > = values in this interval not displayed.   Basic Metabolic Panel:  Lab Results  Component Value Date   NA 152 (H) 07/02/2024   K 4.0 07/02/2024   CO2 25 07/02/2024   GLUCOSE 345 (H) 07/02/2024   BUN 44 (H) 07/02/2024   CREATININE 1.64 (H) 07/02/2024   CALCIUM  9.5 07/02/2024   GFRNONAA 33 (L) 07/02/2024  Lipid Panel: No results found for: LDLCALC  HgbA1c:  Lab Results  Component Value Date   HGBA1C 16.7 (H) 07/01/2024   Urine Drug Screen: No results found for: LABOPIA, COCAINSCRNUR, LABBENZ, AMPHETMU, THCU, LABBARB  Alcohol Level     Component Value Date/Time   [facility] <15 07/01/2024 0057   INR  Lab Results  Component Value Date   INR 1.2 07/01/2024   APTT  Lab Results  Component Value Date   APTT 22 (L) 07/01/2024   CT Head without contrast (Personally reviewed): No acute intracranial abnormality.  Aspects is 10.  CT angio Head and Neck with contrast (Personally  reviewed): Negative CTA for acute LVO or other abnormality.  MRI Brain (Personally reviewed): No acute intracranial abnormality  rEEG:  This study is suggestive of generalized cerebral dysfunction (encephalopathy).  No seizures or epileptiform discharges were seen throughout the recording  Assessment   Michelle Henderson is a 68 y.o. female with hx of asthma, DM2, HTN, obesity who was found in her car in the parking lot of an elementary school. Noted to have aphasia slightly out of proportion to confusion along with mild RUE weakness and R hand intermittent twitching concerning clinically for a focal left hemispheric seizures.   Etiology of seizure I suspect is likely hyperglycemia with subsequent improvement in mental status and RUE deficits with improvement in blood glucose/AKI/hydration.  She is not currently quite back to her baseline with some residual confusion but has made significant improvemnet overnight.  With her improvement, and continued metabolic abnormalities, I would expect her to continue to improve over time.   CTH and CTA with no ICH, no LVO or significant stenosis. Low overall clinical suspicion for stroke.   Recommendations  - Continue supportive care/glucose management/hydration per primary team - Can stop Keppra  after her evening dose tonight  - Neurology will continue to be available as needed  ______________________________________________________________________  Signed,  Mimi LELON Ny, NP [facility]  I have seen the patient and reviewed the above note.  Though she is still slightly confused, she is markedly improved compared to admission and I would expect this to continue.  We can stop Keppra  after this evening dose, and I have placed a stop time.  Neurology be available as needed, please call with further questions or concerns.  Aisha Seals, MD [facility]   If 7pm- 7am, please page neurology on call as listed in  AMION.      [1]  Current Facility-Administered Medications:    0.45 % sodium chloride  infusion, , Intravenous, Continuous, Vann, Jessica U, DO, Last Rate: 75 mL/hr at 07/02/24 1043, New Bag at 07/02/24 1043   cefTRIAXone  (ROCEPHIN ) 1 g in sodium chloride  0.9 % 100 mL IVPB, 1 g, Intravenous, Daily, Franky Redia SAILOR, MD, Stopped at 07/02/24 1227   dextrose  50 % solution 0-50 mL, 0-50 mL, Intravenous, PRN, Franky Redia SAILOR, MD   heparin  injection 5,000 Units, 5,000 Units, Subcutaneous, Q8H, Kakrakandy, Arshad N, MD, 5,000 Units at 07/02/24 1307   insulin  aspart (novoLOG ) injection 0-15 Units, 0-15 Units, Subcutaneous, TID WC, Howerter, Justin B, DO, 15 Units at 07/02/24 1307   insulin  aspart (novoLOG ) injection 0-5 Units, 0-5 Units, Subcutaneous, QHS, Howerter, Justin B, DO   insulin  aspart (novoLOG ) injection 4 Units, 4 Units, Subcutaneous, TID WC, Vann, Jessica U, DO, 4 Units at 07/02/24 1307   [START ON 07/03/2024] insulin  glargine (LANTUS ) injection 20 Units, 20 Units, Subcutaneous, Daily, Vann, Jessica U, DO   insulin  glargine (LANTUS ) injection  5 Units, 5 Units, Subcutaneous, Once, Vann, Jessica U, DO   levETIRAcetam  (KEPPRA ) undiluted injection 500 mg, 500 mg, Intravenous, Q12H, Khaliqdina, Salman, MD, 500 mg at 07/02/24 1047

## 2024-07-02 NOTE — Telephone Encounter (Signed)
 Patient Product/process Development Scientist completed.    The patient is insured through [employer]. Patient has Medicare and is not eligible for a copay card, but may be able to apply for patient assistance or Medicare RX Payment Plan (Patient Must reach out to their plan, if eligible for payment plan), if available.    Ran test claim for Lantus  Pen and the current 30 day co-pay is $0.00.  Ran test claim for Novolog  FlexPen and the current 30 day co-pay is $0.00.  Ran test claim for Dexcom G7 Sensor and Requires Prior Authorization  Ran test claim for [employer] 3 Plus Sensor and Requires Prior Authorization  This test claim was processed through [employer]- copay amounts may vary at other pharmacies due to [employer], or as the patient moves through the different stages of their insurance plan.     Reyes Sharps, CPHT Pharmacy Technician Patient Advocate Specialist [facility] Patient Advocate Team Direct Number: [phone]  Fax: [phone]

## 2024-07-02 NOTE — ED Notes (Signed)
 Clotilda Ades RN turned pt insulin  pump back on per Alan Hock RN request d/t awaiting next orders from provider.

## 2024-07-02 NOTE — Progress Notes (Signed)
 PROGRESS NOTE    Michelle Henderson  FMW:[id number] DOB: 24-Nov-1955 DOA: 07/01/2024 PCP: [name], MD    Brief Narrative:  Michelle Henderson is a 68 y.o. female with history of diabetes mellitus type 2, hypertension, hyperlipidemia, chronic kidney disease stage III was brought to the ER by EMS after patient was found sitting in her running car at around 1130 last night.  EMS on arrival found that patient was confused and had some right arm deficit concerning for stroke.  MRI negative for CVA.  Patient was found to have DKA/HHS and severe dehydration.  Improving with treatment of blood sugar and hydration.  Assessment and Plan:  Diabetic ketoacidosis in type 2 diabetes patient's home medication list shows that patient is on Ozempic and metformin.   - Was switched to subcu insulin  overnight on 12/17 - Continue subcu insulin  and sliding scale  Acute encephalopathy  - Suspect related to diabetic coma  Possible seizures  -on Keppra  loading dose -  Per neurology seizure may be precipitated by patient's uncontrolled diabetes - MRI brain negative - EEG not reflective of current seizures  Hypernatremia - Will start IV fluids and work to lower sodium with frequent sodium checks -Encourage p.o. water intake  Acute renal failure with mild hyperkalemia  - from dehydration in the setting of DKA/HHS   -Hold lisinopril hydrochlorothiazide in setting of acute renal failure.   - Continue IV fluids  Erythrocytosis likely from dehydration - follow CBC after hydration.  History of hypertension takes ACE inhibitors and hydrochlorothiazide presently on hold due to acute renal failure.  Follow blood pressure trends closely.  Possible UTI - on ceftriaxone   -follow cultures.  Hyperlipidemia  - Resume statin when able  Deconditioning - PT/OT-patient lives alone so suspect she may need short-term rehabilitation   DVT prophylaxis: heparin  injection 5,000 Units Start: 07/01/24  1400    Code Status: Full Code Family Communication: Called cousin  Disposition Plan:  Level of care: Progressive Status is: Inpatient     Consultants:  Neurology   Subjective: Patient more awake and interactive-falls asleep quickly but when awake conversation is appropriate  Objective: Vitals:   07/02/24 0300 07/02/24 0431 07/02/24 0600 07/02/24 0830  BP: (!) 153/87  132/84 (!) 145/73  Pulse: (!) 102  90 99  Resp: (!) 23  (!) 21 20  Temp:  (!) 97.2 F (36.2 C)    TempSrc:  Oral    SpO2: 97%  97% 99%    Intake/Output Summary (Last 24 hours) at 07/02/2024 1015 Last data filed at 07/02/2024 0654 Gross per 24 hour  Intake 3223.47 ml  Output 800 ml  Net 2423.47 ml   There were no vitals filed for this visit.  Examination:   General: Appearance:    Obese female in no acute distress- dry skin and mucous membranes     Lungs:     respirations unlabored  Heart:    Normal heart rate.    MS:   All extremities are intact.    Neurologic:   Awake, alert- falls asleep quickly       Data Reviewed: I have personally reviewed following labs and imaging studies  CBC: Recent Labs  Lab 07/01/24 0057 07/01/24 0102 07/01/24 0258 07/01/24 0507  WBC 8.9  --   --  8.5  NEUTROABS 7.3  --   --   --   HGB 18.2* 18.4* 15.6* 16.1*  HCT 54.4* 54.0* 46.0 46.7*  MCV 94.1  --   --  90.9  PLT 200  --   --  183   Basic Metabolic Panel: Recent Labs  Lab 07/01/24 0507 07/01/24 1058 07/01/24 1848 07/01/24 2350 07/02/24 0747  NA 146* 156* 154* 152* 154*  K 3.8 3.9 3.9 3.7 3.9  CL 108 114* 116* 116* 116*  CO2 16* 21* 24 22 24   GLUCOSE 828* 205* 357* 477* 248*  BUN 56* 56* 50* 46* 46*  CREATININE 3.02* 2.45* 2.00* 1.82* 1.77*  CALCIUM  8.9 10.0 9.8 9.3 9.6   GFR: CrCl cannot be calculated (Unknown ideal weight.). Liver Function Tests: Recent Labs  Lab 07/01/24 0057  AST 44*  ALT 90*  ALKPHOS 165*  BILITOT 2.4*  PROT 7.3  ALBUMIN 3.7   No results for input(s):  LIPASE, AMYLASE in the last 168 hours. No results for input(s): AMMONIA in the last 168 hours. Coagulation Profile: Recent Labs  Lab 07/01/24 0057  INR 1.2   Cardiac Enzymes: No results for input(s): CKTOTAL, CKMB, CKMBINDEX, TROPONINI in the last 168 hours. BNP (last 3 results) No results for input(s): PROBNP in the last 8760 hours. HbA1C: Recent Labs    07/01/24 0507  HGBA1C 16.7*   CBG: Recent Labs  Lab 07/02/24 0159 07/02/24 0335 07/02/24 0434 07/02/24 0548 07/02/24 0652  GLUCAP 115* 246* 237* 240* 234*   Lipid Profile: No results for input(s): CHOL, HDL, LDLCALC, TRIG, CHOLHDL, LDLDIRECT in the last 72 hours. Thyroid  Function Tests: No results for input(s): TSH, T4TOTAL, FREET4, T3FREE, THYROIDAB in the last 72 hours. Anemia Panel: No results for input(s): VITAMINB12, FOLATE, FERRITIN, TIBC, IRON, RETICCTPCT in the last 72 hours. Sepsis Labs: Recent Labs  Lab 07/01/24 0301 07/01/24 0528  LATICACIDVEN 2.6* 3.9*    No results found for this or any previous visit (from the past 240 hours).       Radiology Studies: MR BRAIN WO CONTRAST Result Date: 07/01/2024 EXAM: MRI BRAIN WITHOUT CONTRAST 07/01/2024 05:05:00 PM TECHNIQUE: Multiplanar multisequence MRI of the head/brain was performed without the administration of intravenous contrast. COMPARISON: CT head and CTA head and neck earlier same day. CLINICAL HISTORY: Transient ischemic attack (TIA). FINDINGS: BRAIN AND VENTRICLES: No acute infarct. No intracranial hemorrhage. No mass. No midline shift. No hydrocephalus. Mild areas of T2 and FLAIR hyperintensity in the periventricular white matter likely related to chronic microvascular ischemic changes. Mild age related parenchymal volume loss. The sella is unremarkable. Normal flow voids. ORBITS: No acute abnormality. SINUSES AND MASTOIDS: Small right mastoid effusion. BONES AND SOFT TISSUES: Normal marrow signal. No  acute soft tissue abnormality. IMPRESSION: 1. No acute intracranial abnormality. Electronically signed by: Donnice Mania MD 07/01/2024 05:39 PM EST RP Workstation: [id number]   EEG adult Result Date: 07/01/2024 Shelton Arlin KIDD, MD     07/01/2024 10:37 AM Patient Name: Michelle Henderson MRN: [id number] Epilepsy Attending: Arlin KIDD Shelton Referring Physician/Provider: Khaliqdina, Salman, MD Date: 07/01/2024 Duration: 22.07 mins Patient history:  68 y.o. female who was found in her car in the parking lot of an elementary school. Noted to have aphasia slightly out of proportion to confusion along with mild RUE weakness and R hand intermittent twitching. EEG to evaluate for seizure Level of alertness: Awake AEDs during EEG study: LEV Technical aspects: This EEG study was done with scalp electrodes positioned according to the 10-20 International system of electrode placement. Electrical activity was reviewed with band pass filter of 1-70Hz , sensitivity of 7 uV/mm, display speed of 39mm/sec with a 60Hz  notched filter applied as appropriate. EEG data were recorded continuously  and digitally stored.  Video monitoring was available and reviewed as appropriate. Description: EEG showed continuous generalized predominantly 5 to 7 Hz theta slowing admixed with intermittent 2-3hz  delta slowing. Hyperventilation and photic stimulation were not performed.   ABNORMALITY - Continuous slow, generalized IMPRESSION: This study is suggestive of generalized cerebral dysfunction (encephalopathy). No seizures or epileptiform discharges were seen throughout the recording. Priyanka O Yadav   CT ANGIO HEAD NECK W WO CM (CODE STROKE) Result Date: 07/01/2024 EXAM: CTA Head and Neck with Intravenous Contrast. CT Head without Contrast. CLINICAL HISTORY: Neuro deficit, acute, stroke suspected. TECHNIQUE: Axial CTA images of the head and neck performed with intravenous contrast. MIP reconstructed images were created and reviewed. Axial  computed tomography images of the head/brain performed without intravenous contrast. Note: Per PQRS, the description of internal carotid artery percent stenosis, including 0 percent or normal exam, is based on [employer] American Symptomatic Carotid Endarterectomy Trial (NASCET) criteria. Dose reduction technique was used including one or more of the following: automated exposure control, adjustment of mA and kV according to patient size, and/or iterative reconstruction. CONTRAST: With; COMPARISON: CT from 07/01/2024. FINDINGS: CT HEAD: BRAIN: No acute intraparenchymal hemorrhage. No mass lesion. No CT evidence for acute territorial infarct. No midline shift or extra-axial collection. VENTRICLES: No hydrocephalus. ORBITS: The orbits are unremarkable. SINUSES AND MASTOIDS: The paranasal sinuses and mastoid air cells are clear. CTA NECK: COMMON CAROTID ARTERIES: Mild atheromatous change about the right carotid bulb without stenosis. No significant stenosis of the left common carotid artery. No dissection or occlusion. INTERNAL CAROTID ARTERIES: No stenosis by NASCET criteria. No dissection or occlusion. VERTEBRAL ARTERIES: No significant stenosis. No dissection or occlusion. CTA HEAD: ANTERIOR CEREBRAL ARTERIES: No significant stenosis. No occlusion. No aneurysm. MIDDLE CEREBRAL ARTERIES: No significant stenosis. No occlusion. No aneurysm. POSTERIOR CEREBRAL ARTERIES: No significant stenosis. No occlusion. No aneurysm. BASILAR ARTERY: No significant stenosis. No occlusion. No aneurysm. OTHER: SOFT TISSUES: No acute finding. No masses or lymphadenopathy. BONES: Moderate multilevel cervical spondylosis, most pronounced at C7-T1. No acute osseous abnormality of the visualized portions of the skull. IMPRESSION: 1. Negative CTA for acute . LVO or other abnormality. 2. Findings communicated by text page to Dr. Vanessa at 1:13 am on 07/01/2024. Electronically signed by: Morene Hoard MD 07/01/2024 01:17 AM EST RP  Workstation: [id number]   CT HEAD CODE STROKE WO CONTRAST Result Date: 07/01/2024 EXAM: CT HEAD WITHOUT CONTRAST 07/01/2024 12:57:46 AM TECHNIQUE: CT of the head was performed without the administration of intravenous contrast. Automated exposure control, iterative reconstruction, and/or weight based adjustment of the mA/kV was utilized to reduce the radiation dose to as low as reasonably achievable. COMPARISON: None available. CLINICAL HISTORY: Neuro deficit, acute, stroke suspected Neuro deficit, acute, stroke suspected FINDINGS: BRAIN AND VENTRICLES: No acute hemorrhage. No evidence of acute infarct. No hydrocephalus. No extra-axial collection. No mass effect or midline shift. ORBITS: No acute abnormality. SINUSES: No acute abnormality. SOFT TISSUES AND SKULL: No acute soft tissue abnormality. No skull fracture. IMPRESSION: 1. No acute intracranial abnormality. 2. Aspects is 10. 3. Findings communicated by text page to Dr. Vanessa at 1:01 am on 07/01/2024. Electronically signed by: Morene Hoard MD 07/01/2024 01:02 AM EST RP Workstation: [id number]        Scheduled Meds:  heparin   5,000 Units Subcutaneous Q8H   insulin  aspart  0-15 Units Subcutaneous TID WC   insulin  aspart  0-5 Units Subcutaneous QHS   insulin  glargine  15 Units Subcutaneous Daily   levETIRAcetam   500 mg Intravenous Q12H  Continuous Infusions:  sodium chloride      cefTRIAXone  (ROCEPHIN )  IV Stopped (07/01/24 0503)     LOS: 1 day    Time spent: 45 minutes spent on chart review, discussion with nursing staff, consultants, updating family and interview/physical exam; more than 50% of that time was spent in counseling and/or coordination of care.    Harlene RAYMOND Bowl, DO [facility] Available via Epic secure chat 7am-7pm After these hours, please refer to coverage provider listed on [email or web address] 07/02/2024, 10:15 AM

## 2024-07-03 ENCOUNTER — Other Ambulatory Visit (HOSPITAL_COMMUNITY): Payer: Self-pay

## 2024-07-03 DIAGNOSIS — N390 Urinary tract infection, site not specified: Secondary | ICD-10-CM

## 2024-07-03 DIAGNOSIS — N179 Acute kidney failure, unspecified: Secondary | ICD-10-CM | POA: Diagnosis not present

## 2024-07-03 DIAGNOSIS — E1111 Type 2 diabetes mellitus with ketoacidosis with coma: Secondary | ICD-10-CM | POA: Diagnosis not present

## 2024-07-03 DIAGNOSIS — R569 Unspecified convulsions: Secondary | ICD-10-CM | POA: Diagnosis not present

## 2024-07-03 DIAGNOSIS — G9340 Encephalopathy, unspecified: Secondary | ICD-10-CM | POA: Diagnosis not present

## 2024-07-03 LAB — COMPREHENSIVE METABOLIC PANEL WITH GFR
ALT: 46 U/L — ABNORMAL HIGH (ref 0–44)
AST: 27 U/L (ref 15–41)
Albumin: 2.8 g/dL — ABNORMAL LOW (ref 3.5–5.0)
Alkaline Phosphatase: 108 U/L (ref 38–126)
Anion gap: 10 (ref 5–15)
BUN: 35 mg/dL — ABNORMAL HIGH (ref 8–23)
CO2: 22 mmol/L (ref 22–32)
Calcium: 8.3 mg/dL — ABNORMAL LOW (ref 8.9–10.3)
Chloride: 108 mmol/L (ref 98–111)
Creatinine, Ser: 1.32 mg/dL — ABNORMAL HIGH (ref 0.44–1.00)
GFR, Estimated: 43 mL/min — ABNORMAL LOW (ref >60)
Glucose, Bld: 258 mg/dL — ABNORMAL HIGH (ref 70–99)
Potassium: 3.5 mmol/L (ref 3.5–5.1)
Sodium: 140 mmol/L (ref 135–145)
Total Bilirubin: 0.7 mg/dL (ref 0.0–1.2)
Total Protein: 4.8 g/dL — ABNORMAL LOW (ref 6.5–8.1)

## 2024-07-03 LAB — BETA-HYDROXYBUTYRIC ACID: Beta-Hydroxybutyric Acid: 1.32 mmol/L — ABNORMAL HIGH (ref 0.05–0.27)

## 2024-07-03 LAB — CBC
HCT: 36 % (ref 36.0–46.0)
Hemoglobin: 12.4 g/dL (ref 12.0–15.0)
MCH: 32 pg (ref 26.0–34.0)
MCHC: 34.4 g/dL (ref 30.0–36.0)
MCV: 92.8 fL (ref 80.0–100.0)
Platelets: 84 K/uL — ABNORMAL LOW (ref 150–400)
RBC: 3.88 MIL/uL (ref 3.87–5.11)
RDW: 13.6 % (ref 11.5–15.5)
WBC: 5.5 K/uL (ref 4.0–10.5)
nRBC: 0 % (ref 0.0–0.2)

## 2024-07-03 LAB — CREATININE, URINE, RANDOM: Creatinine, Urine: 148 mg/dL

## 2024-07-03 LAB — GLUCOSE, CAPILLARY
Glucose-Capillary: 245 mg/dL — ABNORMAL HIGH (ref 70–99)
Glucose-Capillary: 254 mg/dL — ABNORMAL HIGH (ref 70–99)
Glucose-Capillary: 259 mg/dL — ABNORMAL HIGH (ref 70–99)
Glucose-Capillary: 279 mg/dL — ABNORMAL HIGH (ref 70–99)
Glucose-Capillary: 252 mg/dL — ABNORMAL HIGH (ref 70–99)

## 2024-07-03 LAB — BASIC METABOLIC PANEL WITH GFR
Anion gap: 11 (ref 5–15)
BUN: 38 mg/dL — ABNORMAL HIGH (ref 8–23)
CO2: 24 mmol/L (ref 22–32)
Calcium: 8.6 mg/dL — ABNORMAL LOW (ref 8.9–10.3)
Chloride: 113 mmol/L — ABNORMAL HIGH (ref 98–111)
Creatinine, Ser: 1.45 mg/dL — ABNORMAL HIGH (ref 0.44–1.00)
GFR, Estimated: 39 mL/min — ABNORMAL LOW (ref >60)
Glucose, Bld: 221 mg/dL — ABNORMAL HIGH (ref 70–99)
Potassium: 4 mmol/L (ref 3.5–5.1)
Sodium: 148 mmol/L — ABNORMAL HIGH (ref 135–145)

## 2024-07-03 LAB — OSMOLALITY, URINE: Osmolality, Ur: 655 mosm/kg (ref 300–900)

## 2024-07-03 LAB — SODIUM, URINE, RANDOM: Sodium, Ur: <30 mmol/L

## 2024-07-03 MED ORDER — ATORVASTATIN CALCIUM 10 MG PO TABS
20.0000 mg | ORAL_TABLET | ORAL | Status: DC
  Administered 2024-07-03 – 2024-07-07 (×3): 20 mg via ORAL
  Filled 2024-07-03 (×3): qty 2

## 2024-07-03 MED ORDER — FLUTICASONE PROPIONATE 50 MCG/ACT NA SUSP
2.0000 | Freq: Every day | NASAL | Status: DC
  Administered 2024-07-03 – 2024-07-07 (×5): 2 via NASAL
  Filled 2024-07-03: qty 16

## 2024-07-03 MED ORDER — INSULIN GLARGINE 100 UNIT/ML ~~LOC~~ SOLN
26.0000 [IU] | Freq: Every day | SUBCUTANEOUS | Status: DC
  Administered 2024-07-03: 08:00:00 26 [IU] via SUBCUTANEOUS
  Filled 2024-07-03 (×2): qty 0.26

## 2024-07-03 MED ORDER — LORATADINE 10 MG PO TABS
10.0000 mg | ORAL_TABLET | Freq: Every day | ORAL | Status: DC
  Administered 2024-07-03 – 2024-07-07 (×5): 10 mg via ORAL
  Filled 2024-07-03 (×5): qty 1

## 2024-07-03 MED ADMIN — Insulin Aspart Inj Soln 100 Unit/ML: 4 [IU] | SUBCUTANEOUS | @ 03:00:00 | NDC 73070010011

## 2024-07-03 MED FILL — Insulin Aspart Inj Soln 100 Unit/ML: 4.0000 [IU] | INTRAMUSCULAR | Qty: 4 | Status: AC

## 2024-07-03 NOTE — Plan of Care (Signed)
°  Problem: Coping: Goal: Ability to adjust to condition or change in health will improve Outcome: Progressing   Problem: Fluid Volume: Goal: Ability to maintain a balanced intake and output will improve Outcome: Progressing   Problem: Health Behavior/Discharge Planning: Goal: Ability to identify and utilize available resources and services will improve Outcome: Progressing Goal: Ability to manage health-related needs will improve Outcome: Progressing   Problem: Nutritional: Goal: Maintenance of adequate nutrition will improve Outcome: Progressing   Problem: Fluid Volume: Goal: Ability to achieve a balanced intake and output will improve Outcome: Progressing   Problem: Metabolic: Goal: Ability to maintain appropriate glucose levels will improve Outcome: Progressing

## 2024-07-03 NOTE — Progress Notes (Addendum)
 Physical Therapy Treatment Patient Details Name: Michelle Henderson MRN: [id number] DOB: November 03, 1955 Today's Date: 07/03/2024   History of Present Illness Pt is 68 year old presented to [facility] on  07/01/24 for aphasia and mild RUE weaknss and rt hand intermittent twitching. Pt with DKA possible seizure and acute encephalopathy.  PMH - DM, HTN, obesity    PT Comments  Session focused on progressing gait and therapeutic exercise to promote strength. Pt tolerates seated exercises well and demonstrates good control through the full available range of motion. Pt is CGA for transfers and ambulation in hallway. Decreased gait speed and L foot clearance noted with onset of fatigue. Pt reminded to take standing rest breaks as needed but declined at this time. HR monitored throughout, ranging 100s-130 during session, pt asymptomatic throughout. Pt would benefit from continued PT services focused on strength, balance, and progressing gait to promote activity tolerance. D/C rec updated to <3hrs therapy due to pt living alone and being significantly deconditioned. Pt would benefit form further rehab prior to safe discharge home.      If plan is discharge home, recommend the following: A little help with walking and/or transfers;A little help with bathing/dressing/bathroom;Assistance with cooking/housework;Assist for transportation;Help with stairs or ramp for entrance   Can travel by private vehicle        Equipment Recommendations  Rolling walker (2 wheels);BSC/3in1    Recommendations for Other Services       Precautions / Restrictions Precautions Precautions: Fall Recall of Precautions/Restrictions: Intact Restrictions Weight Bearing Restrictions Per Provider Order: No     Mobility  Bed Mobility               General bed mobility comments: Pt seated in recliner upon arrival    Transfers Overall transfer level: Needs assistance Equipment used: Rolling walker (2 wheels) Transfers: Sit  to/from Stand Sit to Stand: Contact guard assist           General transfer comment: Pt with increased trunk flexion noted when attempting to stand, corrects posture once standing with support of rolling walker. Steady upon standing with walker.    Ambulation/Gait Ambulation/Gait assistance: Contact guard assist Gait Distance (Feet): 160 Feet (70 ft plus 90 ft) Assistive device: Rolling walker (2 wheels) Gait Pattern/deviations: Step-through pattern, Decreased step length - right, Decreased step length - left, Narrow base of support   Gait velocity interpretation: <1.8 ft/sec, indicate of risk for recurrent falls   General Gait Details: Difficulty with L foot clearance with onset of fatigue. Pt providing self cues but foot can still be heard dragging on the ground. No LOB occurs, benefits from support of walker.   Stairs             Wheelchair Mobility     Tilt Bed    Modified Rankin (Stroke Patients Only)       Balance Overall balance assessment: Mild deficits observed, not formally tested Sitting-balance support: No upper extremity supported Sitting balance-Leahy Scale: Good Sitting balance - Comments: No sitting balance concerns   Standing balance support: Bilateral upper extremity supported Standing balance-Leahy Scale: Fair Standing balance comment: Pt unsteady with activity due to onset of fatigue. No LOB during session. Does not use walker at baseline but benefits from use at this time.                            Communication Communication Communication: No apparent difficulties  Cognition Arousal: Alert Behavior During Therapy:  WFL for tasks assessed/performed   PT - Cognitive impairments: No apparent impairments                         Following commands: Intact Following commands impaired: Follows multi-step commands with increased time    Cueing Cueing Techniques: Verbal cues, Tactile cues, Visual cues  Exercises General  Exercises - Lower Extremity Ankle Circles/Pumps: AROM, Strengthening, Seated, 10 reps, Both Long Arc Quad: AROM, Both, 10 reps, Seated, Strengthening Hip ABduction/ADduction: AROM, Strengthening, Both, 10 reps, Seated Hip Flexion/Marching: AROM, Strengthening, Both, 10 reps, Seated    General Comments General comments (skin integrity, edema, etc.): No new skin abnormalities noted. HR ranges 100-130 at rest and with activity, pt asymptomatic throughout.      Pertinent Vitals/Pain Pain Assessment Pain Assessment: No/denies pain    Home Living Family/patient expects to be discharged to:: Private residence Living Arrangements: Alone Available Help at Discharge: Family;Available PRN/intermittently Type of Home: House Home Access: Level entry       Home Layout: One level Home Equipment: Rollator (4 wheels)      Prior Function            PT Goals (current goals can now be found in the care plan section) Acute Rehab PT Goals Patient Stated Goal: No new goals stated during session.    Frequency    Min 2X/week      PT Plan      Co-evaluation              AM-PAC PT 6 Clicks Mobility   Outcome Measure  Help needed turning from your back to your side while in a flat bed without using bedrails?: A Little Help needed moving from lying on your back to sitting on the side of a flat bed without using bedrails?: A Little Help needed moving to and from a bed to a chair (including a wheelchair)?: A Little Help needed standing up from a chair using your arms (e.g., wheelchair or bedside chair)?: A Little Help needed to walk in hospital room?: A Little Help needed climbing 3-5 steps with a railing? : A Lot 6 Click Score: 17    End of Session   Activity Tolerance: Patient limited by fatigue Patient left: in chair;with call bell/phone within reach;with chair alarm set;with family/visitor present Nurse Communication: Mobility status PT Visit Diagnosis: Unsteadiness on  feet (R26.81);Muscle weakness (generalized) (M62.81)     Time: 9044-8979 PT Time Calculation (min) (ACUTE ONLY): 25 min  Charges:    $Gait Training: 8-22 mins $Therapeutic Exercise: 8-22 mins PT General Charges $$ ACUTE PT VISIT: 1 Visit                     Sabra Morel, PT, DPT  Acute Rehabilitation Services         Office: [phone]      Sabra MARLA Morel 07/03/2024, 12:26 PM

## 2024-07-03 NOTE — Progress Notes (Signed)
 [facility] night cross cover note:   I was notified by the patient's RN of the patient's most recent beta-hydroxybutyrate acid level of 1.32 when drawn around midnight today, in this patient that was admitted for DKA on 07/01/2024.  Most recent glucose, per BMP drawn around midnight was noted to be 221, relative to preceding CBG result of 186 around 2100 on 07/02/2024.   I subsequently reviewed her most recent BMP from midnight, which shows bicarbonate within normal limits at 24, as well as anion gap that remains closed at 11.  BMP results are reassuring that the patient is not currently in DKA.   However, with blood sugar appearing to be trending up, I have placed an additional order for NovoLog  4 units subcu x 1 dose now. Remains on continuous ivf's with 1/2NS.     Eva Pore, DO Hospitalist

## 2024-07-03 NOTE — Progress Notes (Signed)
 PROGRESS NOTE        PATIENT DETAILS Name: Michelle Henderson Age: 68 y.o. Sex: female Date of Birth: Dec 22, 1955 Admit Date: 07/01/2024 Admitting Physician Redia LOISE Cleaver, MD ERE:[id number], Erminio CROME, MD  Brief Summary: Patient is a 68 y.o.  female with history of DM-2, HTN, HLD-presented with confusion/mild RUE weakness/intermittent hand twitching-she was found to have DKA-focal seizures.  Significant events: 12/16>> admit to [facility]  Significant studies: 12/16>> CT head: No acute intracranial abnormality 12/16>> CT angio head/neck: No LVO/significant stenosis 12/16>> MRI brain: No acute intracranial abnormality 12/16>> EEG: No seizures  Significant microbiology data: None  Procedures: None  Consults: Neuro   Subjective: Lying comfortably in bed-denies any chest pain or shortness of breath.  Completely awake/alert  Objective: Vitals: Blood pressure 132/69, pulse 79, temperature 98 F (36.7 C), temperature source Oral, resp. rate 15, SpO2 97%.   Exam: Gen Exam:Alert awake-not in any distress HEENT:atraumatic, normocephalic Chest: B/L clear to auscultation anteriorly CVS:S1S2 regular Abdomen:soft non tender, non distended Extremities:no edema Neurology: Non focal Skin: no rash  Pertinent Labs/Radiology:    Latest Ref Rng & Units 07/03/2024    7:37 AM 07/01/2024    5:07 AM 07/01/2024    2:58 AM  CBC  WBC 4.0 - 10.5 K/uL 5.5  8.5    Hemoglobin 12.0 - 15.0 g/dL 87.5  83.8  84.3   Hematocrit 36.0 - 46.0 % 36.0  46.7  46.0   Platelets 150 - 400 K/uL 84  183      Lab Results  Component Value Date   NA 140 07/03/2024   K 3.5 07/03/2024   CL 108 07/03/2024   CO2 22 07/03/2024      Assessment/Plan: DKA Resolved with IV fluids/IV insulin  Has been transition to SQ insulin .  DM-2 with uncontrolled hyperglycemia (A1c 16.7 on 12/16) CBGs remain elevated Increase Lantus  to 26 units-continue SSI Will be new to insulin -begin  diabetic education/insulin  education  Focal seizure Secondary to metabolic derangements-MRI brain negative for structural abnormalities-EEG negative Per neurology recommendation-no longer on Keppra   Acute metabolic encephalopathy Related to DKA/AKI/postictal state from seizures Resolved-she is back to baseline-completely awake/alert this morning  AKI Secondary to osmotic mediated kidney injury due to DKA Resolving with IVF Doubt CKD-ruled out.  UTI No culture sent Clinically currently asymptomatic Rocephin  x 3 days planned-can stop after today's dose.  HLD Resume statin  HTN All antihypertensives held-BP remains stable Resume accordingly  Debility/deconditioning Secondary to acute illness-Home health recommended.  Class 2 Obesity: Estimated body mass index is 39.26 kg/m as calculated from the following:   Height as of 07/09/22: 5' 1 (1.549 m).   Weight as of 07/09/22: 94.3 kg.   Code status:   Code Status: Full Code   DVT Prophylaxis: heparin  injection 5,000 Units Start: 07/01/24 1400   Family Communication: None at bedside   Disposition Plan: Status is: Inpatient Remains inpatient appropriate because: Severity of illness   Planned Discharge Destination:Home health likely on 12/19 if she is stable overnight.   Diet: Diet Order             Diet regular Room service appropriate? Yes with Assist; Fluid consistency: Thin  Diet effective now                     Antimicrobial agents: Anti-infectives (From admission, onward)    Start  Dose/Rate Route Frequency Ordered Stop   07/01/24 0500  cefTRIAXone  (ROCEPHIN ) 1 g in sodium chloride  0.9 % 100 mL IVPB        1 g 200 mL/hr over 30 Minutes Intravenous Daily 07/01/24 0406          MEDICATIONS: Scheduled Meds:  heparin   5,000 Units Subcutaneous Q8H   insulin  aspart  0-15 Units Subcutaneous TID WC   insulin  aspart  0-5 Units Subcutaneous QHS   insulin  aspart  4 Units Subcutaneous TID WC    insulin  glargine  26 Units Subcutaneous Daily   Continuous Infusions:  cefTRIAXone  (ROCEPHIN )  IV Stopped (07/02/24 1227)   PRN Meds:.dextrose    I have personally reviewed following labs and imaging studies  LABORATORY DATA: CBC: Recent Labs  Lab 07/01/24 0057 07/01/24 0102 07/01/24 0258 07/01/24 0507 07/03/24 0737  WBC 8.9  --   --  8.5 5.5  NEUTROABS 7.3  --   --   --   --   HGB 18.2* 18.4* 15.6* 16.1* 12.4  HCT 54.4* 54.0* 46.0 46.7* 36.0  MCV 94.1  --   --  90.9 92.8  PLT 200  --   --  183 84*    Basic Metabolic Panel: Recent Labs  Lab 07/02/24 1143 07/02/24 1531 07/02/24 1936 07/03/24 0007 07/03/24 0408  NA 152* 153* 149* 148* 140  K 4.0 3.8 3.6 4.0 3.5  CL 114* 115* 113* 113* 108  CO2 25 22 26 24 22   GLUCOSE 345* 175* 208* 221* 258*  BUN 44* 42* 41* 38* 35*  CREATININE 1.64* 1.67* 1.55* 1.45* 1.32*  CALCIUM  9.5 9.6 9.0 8.6* 8.3*    GFR: CrCl cannot be calculated (Unknown ideal weight.).  Liver Function Tests: Recent Labs  Lab 07/01/24 0057 07/03/24 0408  AST 44* 27  ALT 90* 46*  ALKPHOS 165* 108  BILITOT 2.4* 0.7  PROT 7.3 4.8*  ALBUMIN 3.7 2.8*   No results for input(s): LIPASE, AMYLASE in the last 168 hours. No results for input(s): AMMONIA in the last 168 hours.  Coagulation Profile: Recent Labs  Lab 07/01/24 0057  INR 1.2    Cardiac Enzymes: No results for input(s): CKTOTAL, CKMB, CKMBINDEX, TROPONINI in the last 168 hours.  BNP (last 3 results) No results for input(s): PROBNP in the last 8760 hours.  Lipid Profile: No results for input(s): CHOL, HDL, LDLCALC, TRIG, CHOLHDL, LDLDIRECT in the last 72 hours.  Thyroid  Function Tests: Recent Labs    07/02/24 0747  TSH 0.512    Anemia Panel: No results for input(s): VITAMINB12, FOLATE, FERRITIN, TIBC, IRON, RETICCTPCT in the last 72 hours.  Urine analysis:    Component Value Date/Time   COLORURINE YELLOW 07/01/2024 0224    APPEARANCEUR HAZY (A) 07/01/2024 0224   LABSPEC 1.027 07/01/2024 0224   PHURINE 5.0 07/01/2024 0224   GLUCOSEU >=500 (A) 07/01/2024 0224   HGBUR SMALL (A) 07/01/2024 0224   BILIRUBINUR NEGATIVE 07/01/2024 0224   KETONESUR 20 (A) 07/01/2024 0224   PROTEINUR 30 (A) 07/01/2024 0224   NITRITE NEGATIVE 07/01/2024 0224   LEUKOCYTESUR TRACE (A) 07/01/2024 0224    Sepsis Labs: Lactic Acid, Venous    Component Value Date/Time   LATICACIDVEN 3.9 (HH) 07/01/2024 0528    MICROBIOLOGY: No results found for this or any previous visit (from the past 240 hours).  RADIOLOGY STUDIES/RESULTS: MR BRAIN WO CONTRAST Result Date: 07/01/2024 EXAM: MRI BRAIN WITHOUT CONTRAST 07/01/2024 05:05:00 PM TECHNIQUE: Multiplanar multisequence MRI of the head/brain was performed without the administration of  intravenous contrast. COMPARISON: CT head and CTA head and neck earlier same day. CLINICAL HISTORY: Transient ischemic attack (TIA). FINDINGS: BRAIN AND VENTRICLES: No acute infarct. No intracranial hemorrhage. No mass. No midline shift. No hydrocephalus. Mild areas of T2 and FLAIR hyperintensity in the periventricular white matter likely related to chronic microvascular ischemic changes. Mild age related parenchymal volume loss. The sella is unremarkable. Normal flow voids. ORBITS: No acute abnormality. SINUSES AND MASTOIDS: Small right mastoid effusion. BONES AND SOFT TISSUES: Normal marrow signal. No acute soft tissue abnormality. IMPRESSION: 1. No acute intracranial abnormality. Electronically signed by: Donnice Mania MD 07/01/2024 05:39 PM EST RP Workstation: [id number]   EEG adult Result Date: 07/01/2024 Shelton Arlin KIDD, MD     07/01/2024 10:37 AM Patient Name: Eadie Repetto MRN: [id number] Epilepsy Attending: Arlin KIDD Shelton Referring Physician/Provider: Khaliqdina, Salman, MD Date: 07/01/2024 Duration: 22.07 mins Patient history:  68 y.o. female who was found in her car in the parking lot of an  elementary school. Noted to have aphasia slightly out of proportion to confusion along with mild RUE weakness and R hand intermittent twitching. EEG to evaluate for seizure Level of alertness: Awake AEDs during EEG study: LEV Technical aspects: This EEG study was done with scalp electrodes positioned according to the 10-20 International system of electrode placement. Electrical activity was reviewed with band pass filter of 1-70Hz , sensitivity of 7 uV/mm, display speed of 55mm/sec with a 60Hz  notched filter applied as appropriate. EEG data were recorded continuously and digitally stored.  Video monitoring was available and reviewed as appropriate. Description: EEG showed continuous generalized predominantly 5 to 7 Hz theta slowing admixed with intermittent 2-3hz  delta slowing. Hyperventilation and photic stimulation were not performed.   ABNORMALITY - Continuous slow, generalized IMPRESSION: This study is suggestive of generalized cerebral dysfunction (encephalopathy). No seizures or epileptiform discharges were seen throughout the recording. Priyanka KIDD Shelton     LOS: 2 days   Donalda Applebaum, MD  [facility]    To contact the attending provider between 7A-7P or the covering provider during after hours 7P-7A, please log into the web site [email or web address] and access using universal [place] password for that web site. If you do not have the password, please call the hospital operator.  07/03/2024, 10:14 AM

## 2024-07-03 NOTE — TOC Initial Note (Addendum)
 Transition of Care [facility]) - Initial/Assessment Note    Patient Details  Name: Michelle Henderson MRN: [id number] Date of Birth: 02/13/1956  [facility] CM/SW Contact:    Michelle DELENA Senters, RN Phone Number: 07/03/2024, 10:17 AM  Clinical Narrative:                 RR:ypdunmb of diabetes mellitus type 2, hypertension, hyperlipidemia, chronic kidney disease stage III was brought to the ER by EMS after patient was found sitting in her running car at around 1130 last night. EMS on arrival found that patient was confused and had some right arm deficit concerning for stroke.   Patient lives at home alone, does report having support from cousins and extended family, but they closest one lives in [place].  Patient has PCP, manages medications at home, She reports her cousin Marval will be transportation home at d/c.   Therapy rec for [facility]. Patient reports she can't have HH come to her home right now because she has become a hoarder over the past couple of years and no one would be able to get into her home to walk with her right now. Patient does not want family to be involved with this. Patient is wanting to make a change with this and is open to resources.   CM will continue to follow.   14:32 - Therapy rec PT/OT updated to SNF. With permission, CM faxed out patient info.     Expected Discharge Plan:  (TBD) Barriers to Discharge: Continued Medical Work up   Patient Goals and CMS Choice            Expected Discharge Plan and Services       Living arrangements for the past 2 months: Single Family Home                                      Prior Living Arrangements/Services Living arrangements for the past 2 months: Single Family Home Lives with:: Self Patient language and need for interpreter reviewed:: Yes Do you feel safe going back to the place where you live?: Yes      Need for Family Participation in Patient Care: Yes (Comment) Care giver support system in  place?: Yes (comment)   Criminal Activity/Legal Involvement Pertinent to Current Situation/Hospitalization: No - Comment as needed  Activities of Daily Living      Permission Sought/Granted                  Emotional Assessment Appearance:: Developmentally appropriate Attitude/Demeanor/Rapport: Engaged Affect (typically observed): Calm Orientation: : Oriented to Self, Oriented to Place, Oriented to  Time, Oriented to Situation Alcohol / Substance Use: Not Applicable Psych Involvement: No (comment)  Admission diagnosis:  TIA (transient ischemic attack) [G45.9] DKA (diabetic ketoacidosis) (HCC) [E11.10] AKI (acute kidney injury) [N17.9] Seizure-like activity (HCC) [R56.9] Diabetic ketoacidosis with coma associated with type 2 diabetes mellitus (HCC) [E11.11] Patient Active Problem List   Diagnosis Date Noted   DKA (diabetic ketoacidosis) (HCC) 07/01/2024   Acute encephalopathy 07/01/2024   ARF (acute renal failure) 07/01/2024   HLD (hyperlipidemia) 07/01/2024   Seizure (HCC) 07/01/2024   Erythrocytosis 07/01/2024   UTI (urinary tract infection) 07/01/2024   Facial cellulitis 07/09/2022   Essential hypertension 07/09/2022   Controlled type 2 diabetes mellitus without complication, without long-term current use of insulin  (HCC) 07/09/2022   Dehydration 07/09/2022   Tachycardia 07/09/2022  Obesity (BMI 30-39.9) 07/09/2022   PCP:  [name], MD Pharmacy:   [facility] [phone] - [place], [place] - [address] AT [facility] OF [address] [place] [address] Phone: [phone] Fax: [phone]     Social Drivers of Health (SDOH) Social History: SDOH Screenings   Food Insecurity: No Food Insecurity (12/04/2023)   Received from [facility]  Housing: Low Risk (12/04/2023)   [facility] from [facility]  Transportation Needs: No Transportation Needs (12/04/2023)   Received from [facility]  Utilities: Not At Risk  (12/04/2023)   Received from [facility]  Financial Resource Strain: Low Risk (12/04/2023)   Received from [facility]  Physical Activity: Insufficiently Active (12/04/2023)   Received from [facility]  Social Connections: Moderately Integrated (12/04/2023)   Received from [facility]  Stress: No Stress Concern Present (12/04/2023)   Received from [facility]  Tobacco Use: Low Risk (07/01/2024)   SDOH Interventions:     Readmission Risk Interventions     No data to display

## 2024-07-03 NOTE — Discharge Instructions (Signed)
 [employer] for Educational Videos:  [email or web address]

## 2024-07-03 NOTE — Evaluation (Signed)
 Occupational Therapy Evaluation Patient Details Name: Michelle Henderson MRN: [id number] DOB: 01-02-1956 Today's Date: 07/03/2024   History of Present Illness   Pt is 68 year old presented to [facility] on  07/01/24 for aphasia and mild RUE weaknss and rt hand intermittent twitching. Pt with DKA possible seizure and acute encephalopathy.  PMH - DM, HTN, obesity     Clinical Impressions Patient admitted for the diagnosis above.  PTA she lives alone at home with PRN assist from family if needed.  Currently she presents with poor safety, decreased insight into deficits, poor activity tolerance and fair dynamic balance at RW level.  Cousin, in the room, and patient are planning for SNF level rehab due to decreased cognition and weakness, as she does not have the needed 24 hour support at home to transition there.  OT will continue efforts in the acute setting to address deficits, and Patient will benefit from continued inpatient follow up therapy, <3 hours/day.     If plan is discharge home, recommend the following:   A little help with walking and/or transfers;A little help with bathing/dressing/bathroom;Assist for transportation;Assistance with cooking/housework;Direct supervision/assist for financial management;Direct supervision/assist for medications management;Supervision due to cognitive status     Functional Status Assessment   Patient has had a recent decline in their functional status and demonstrates the ability to make significant improvements in function in a reasonable and predictable amount of time.     Equipment Recommendations   Tub/shower seat     Recommendations for Other Services         Precautions/Restrictions   Precautions Precautions: Fall Recall of Precautions/Restrictions: Impaired Restrictions Weight Bearing Restrictions Per Provider Order: No     Mobility Bed Mobility Overal bed mobility: Needs Assistance Bed Mobility: Supine to Sit      Supine to sit: Contact guard          Transfers Overall transfer level: Needs assistance Equipment used: Rolling walker (2 wheels) Transfers: Sit to/from Stand, Bed to chair/wheelchair/BSC Sit to Stand: Contact guard assist, Min assist     Step pivot transfers: Supervision, Contact guard assist            Balance Overall balance assessment: Needs assistance Sitting-balance support: Feet supported Sitting balance-Leahy Scale: Good     Standing balance support: Reliant on assistive device for balance Standing balance-Leahy Scale: Fair                             ADL either performed or assessed with clinical judgement   ADL Overall ADL's : Needs assistance/impaired Eating/Feeding: Independent;Sitting   Grooming: Supervision/safety;Standing   Upper Body Bathing: Set up;Sitting   Lower Body Bathing: Minimal assistance;Sit to/from stand   Upper Body Dressing : Supervision/safety;Sitting   Lower Body Dressing: Minimal assistance;Sit to/from stand   Toilet Transfer: Ambulance Person;Ambulation   Toileting- Clothing Manipulation and Hygiene: Minimal assistance;Sit to/from stand               Vision Baseline Vision/History: 1 Wears glasses Patient Visual Report: No change from baseline       Perception Perception: Not tested       Praxis Praxis: Not tested       Pertinent Vitals/Pain Pain Assessment Pain Assessment: No/denies pain     Extremity/Trunk Assessment Upper Extremity Assessment Upper Extremity Assessment: Overall WFL for tasks assessed   Lower Extremity Assessment Lower Extremity Assessment: Defer to PT evaluation   Cervical / Trunk Assessment  Cervical / Trunk Assessment: Normal   Communication Communication Communication: No apparent difficulties   Cognition Arousal: Alert Behavior During Therapy: WFL for tasks assessed/performed Cognition: Cognition impaired       Memory impairment (select  all impairments): Short-term memory Attention impairment (select first level of impairment): Selective attention Executive functioning impairment (select all impairments): Reasoning, Problem solving                   Following commands: Impaired Following commands impaired: Follows multi-step commands with increased time     Cueing  General Comments   Cueing Techniques: Verbal cues      Exercises     Shoulder Instructions      Home Living Family/patient expects to be discharged to:: Private residence Living Arrangements: Alone Available Help at Discharge: Family;Available PRN/intermittently Type of Home: House Home Access: Level entry     Home Layout: One level     Bathroom Shower/Tub: Tub/shower unit;Walk-in shower   Bathroom Toilet: Handicapped height Bathroom Accessibility: Yes How Accessible: Accessible via walker Home Equipment: Rollator (4 wheels)          Prior Functioning/Environment Prior Level of Function : Independent/Modified Independent;Driving             Mobility Comments: does not use assistive device ADLs Comments: No assist with ADL,iADL, continues to drive.  Manages bills and medications.    OT Problem List: Impaired balance (sitting and/or standing);Decreased knowledge of use of DME or AE;Decreased activity tolerance;Decreased strength;Decreased safety awareness   OT Treatment/Interventions: Self-care/ADL training;Therapeutic activities;Patient/family education;Balance training;DME and/or AE instruction      OT Goals(Current goals can be found in the care plan section)   Acute Rehab OT Goals Patient Stated Goal: Return home OT Goal Formulation: With patient Time For Goal Achievement: 07/17/24 Potential to Achieve Goals: Good ADL Goals Pt Will Perform Grooming: with modified independence;standing Pt Will Perform Lower Body Dressing: with modified independence;sit to/from stand Pt Will Transfer to Toilet: with modified  independence;ambulating;regular height toilet   OT Frequency:  Min 2X/week    Co-evaluation              AM-PAC OT 6 Clicks Daily Activity     Outcome Measure Help from another person eating meals?: None Help from another person taking care of personal grooming?: A Little Help from another person toileting, which includes using toliet, bedpan, or urinal?: A Little Help from another person bathing (including washing, rinsing, drying)?: A Little Help from another person to put on and taking off regular upper body clothing?: None Help from another person to put on and taking off regular lower body clothing?: A Little 6 Click Score: 20   End of Session Equipment Utilized During Treatment: Rolling walker (2 wheels) Nurse Communication: Mobility status  Activity Tolerance: Patient tolerated treatment well Patient left: in chair;with call bell/phone within reach;with family/visitor present;with chair alarm set  OT Visit Diagnosis: Unsteadiness on feet (R26.81);Other symptoms and signs involving cognitive function                Time: 0937-1000 OT Time Calculation (min): 23 min Charges:  OT General Charges $OT Visit: 1 Visit OT Evaluation $OT Eval Moderate Complexity: 1 Mod OT Treatments $Self Care/Home Management : 8-22 mins  07/03/2024  RP, OTR/L  Acute Rehabilitation Services  Office:  [phone]   Michelle Henderson 07/03/2024, 10:06 AM

## 2024-07-03 NOTE — Inpatient Diabetes Management (Signed)
 Inpatient Diabetes Program Recommendations  AACE/ADA: New Consensus Statement on Inpatient Glycemic Control (2015)  Target Ranges:  Prepandial:   less than 140 mg/dL      Peak postprandial:   less than 180 mg/dL (1-2 hours)      Critically ill patients:  140 - 180 mg/dL    Latest Reference Range & Units 07/01/24 05:07  Hemoglobin A1C 4.8 - 5.6 % 16.7 (H)  (H): Data is abnormally high  Latest Reference Range & Units 07/03/24 08:14 07/03/24 12:22  Glucose-Capillary 70 - 99 mg/dL 745 (H)  12 units Novolog   26 units Lantus   259 (H)  12 units Novolog    (H): Data is abnormally high   Admit with: DKA   History: DM2   Home DM Meds: Metformin 500 mg BID                             Ozempic 2 mg Qweek   Current Orders: Lantus  26 units daily      Novolog  Moderate Correction Scale/ SSI (0-15 units) TID AC + HS      Novolog  4 units TID with meals     PCP: Dr. Erminio Sensing with [facility] Last Seen 12/07/2023 A1c at that visit was 6.4%    Met w/ pt and her cousin Marval again today.  Pt much more alert and participative today.  Pt stated to me she doesn't feel like she can go home and wants to go to Rehab after d/c.  Is open to using insulin  and is interested in using CGM at home to monitor CBGs.  Cousin willing to help pt with CGM and insulin  in the beginning if needed.  Pt has used Ozempic and did well with the insulin  pen demo.  Educated patient and cousin Marval on insulin  pen use at home.  Reviewed all steps of insulin  pen including attachment of needle, 2-unit air shot, dialing up dose, giving injection, rotation of injection sites, removing needle, disposal of sharps, storage of unused insulin , disposal of insulin  etc.  Patient able to provide successful return demonstration.  Reviewed troubleshooting with insulin  pen.  Also reviewed Signs/Symptoms of Hypoglycemia with patient and how to treat Hypoglycemia at home.  Have asked RNs caring for patient to  please allow patient to give all injections here in hospital as much as possible for practice.  MD to give patient Rxs for insulin  pens and insulin  pen needles.    Also discussed using CGM at home.  Explained what CGM is and how it differs from traditional fingerstick CBG.  Discussed with pt that she needs a traditional glucometer at home but that she can use a glucose sensor for most of her glucose readings--explained that pt needs fingerstick meter for back up in case CGM does not match how she feels.  Explained how CGM works, how to apply, how to use either phone or reader to get glucose readings.  Cousin of pt told me that pt's cell phone is currently locked out (they tried to put in wrong Password key too many times).  Cousin trying to get phone unlocked.  I showed pt's cousin how to download the app using cousin's phone as a demo.  Explained that pt would need refills for the sensors from her PCP--we can give her 1 or 2 samples to get started.  If she goes to Rehab, would not start the Sensor until she goes home.  Told pt and  cousin I will place web addresses for instructional videos to AVS.  If pt cannot get her phone unlocked, will need a reader for the Glucose sensor.  Pt has been approved to get the Freestyle Libre 3 CGM for $0 co-pay--Lantus  and Novolog  also approved at $0 co-pays.  Sent [place] to MD to make him aware of this info.      --Will follow patient during hospitalization--  Adina Rudolpho Arrow RN, MSN, CDCES Diabetes Coordinator Inpatient Glycemic Control Team Team Pager: [phone] (8a-5p)

## 2024-07-03 NOTE — NC FL2 (Signed)
   MEDICAID FL2 LEVEL OF CARE FORM     IDENTIFICATION  Patient Name: Michelle Henderson Birthdate: 1955/12/06 Sex: female Admission Date (Current Location): 07/01/2024  [facility] and [place] Number:  Producer, Television/film/video and Address:  [facility] [place], [address], [place], [place] [address]      Provider Number: [id number]  Attending Physician Name and Address:  Raenelle Donalda HERO, MD  Relative Name and Phone Number:       Current Level of Care: Hospital Recommended Level of Care: Skilled Nursing Facility Prior Approval Number:    Date Approved/Denied:   PASRR Number: [id number]  Discharge Plan: SNF    Current Diagnoses: Patient Active Problem List   Diagnosis Date Noted   DKA (diabetic ketoacidosis) (HCC) 07/01/2024   Acute encephalopathy 07/01/2024   ARF (acute renal failure) 07/01/2024   HLD (hyperlipidemia) 07/01/2024   Seizure (HCC) 07/01/2024   Erythrocytosis 07/01/2024   UTI (urinary tract infection) 07/01/2024   Facial cellulitis 07/09/2022   Essential hypertension 07/09/2022   Controlled type 2 diabetes mellitus without complication, without long-term current use of insulin  (HCC) 07/09/2022   Dehydration 07/09/2022   Tachycardia 07/09/2022   Obesity (BMI 30-39.9) 07/09/2022    Orientation RESPIRATION BLADDER Height & Weight     Self, Time, Situation, Place  Normal Continent Weight:   Height:     BEHAVIORAL SYMPTOMS/MOOD NEUROLOGICAL BOWEL NUTRITION STATUS      Continent Diet (See dc summary)  AMBULATORY STATUS COMMUNICATION OF NEEDS Skin   Supervision Verbally Normal                       Personal Care Assistance Level of Assistance  Bathing, Feeding, Dressing Bathing Assistance: Limited assistance Feeding assistance: Independent Dressing Assistance: Independent     Functional Limitations Info  Hearing   Hearing Info: Impaired      SPECIAL CARE FACTORS FREQUENCY  PT (By licensed PT), OT (By  licensed OT)     PT Frequency: 5x/week OT Frequency: 5x/week            Contractures Contractures Info: Not present    Additional Factors Info  Code Status, Allergies, Insulin  Sliding Scale Code Status Info: Full Allergies Info: Nsaids, Penicillins   Insulin  Sliding Scale Info: see dc summary       Current Medications (07/03/2024):  This is the current hospital active medication list Current Facility-Administered Medications  Medication Dose Route Frequency Provider Last Rate Last Admin   atorvastatin  (LIPITOR) tablet 20 mg  20 mg Oral QODAY Ghimire, Shanker M, MD   20 mg at 07/03/24 1048   cefTRIAXone  (ROCEPHIN ) 1 g in sodium chloride  0.9 % 100 mL IVPB  1 g Intravenous Daily Franky Redia SAILOR, MD 200 mL/hr at 07/03/24 1046 1 g at 07/03/24 1046   dextrose  50 % solution 0-50 mL  0-50 mL Intravenous PRN Franky Redia SAILOR, MD       fluticasone  (FLONASE ) 50 MCG/ACT nasal spray 2 spray  2 spray Each Nare Daily Raenelle Donalda HERO, MD   2 spray at 07/03/24 1207   heparin  injection 5,000 Units  5,000 Units Subcutaneous Q8H Kakrakandy, Arshad N, MD   5,000 Units at 07/03/24 1453   insulin  aspart (novoLOG ) injection 0-15 Units  0-15 Units Subcutaneous TID WC Howerter, Justin B, DO   8 Units at 07/03/24 1250   insulin  aspart (novoLOG ) injection 0-5 Units  0-5 Units Subcutaneous QHS Howerter, Justin B, DO  insulin  aspart (novoLOG ) injection 4 Units  4 Units Subcutaneous TID WC Vann, Jessica U, DO   4 Units at 07/03/24 1250   insulin  glargine (LANTUS ) injection 26 Units  26 Units Subcutaneous Daily Raenelle Donalda HERO, MD   26 Units at 07/03/24 9171   loratadine  (CLARITIN ) tablet 10 mg  10 mg Oral Daily Ghimire, Shanker M, MD   10 mg at 07/03/24 1048     Discharge Medications: Please see discharge summary for a list of discharge medications.  Relevant Imaging Results:  Relevant Lab Results:   Additional Information SSN [id number]  [name], LCSW

## 2024-07-04 ENCOUNTER — Other Ambulatory Visit: Payer: Self-pay

## 2024-07-04 DIAGNOSIS — R569 Unspecified convulsions: Secondary | ICD-10-CM | POA: Diagnosis not present

## 2024-07-04 DIAGNOSIS — G9340 Encephalopathy, unspecified: Secondary | ICD-10-CM | POA: Diagnosis not present

## 2024-07-04 DIAGNOSIS — N179 Acute kidney failure, unspecified: Secondary | ICD-10-CM | POA: Diagnosis not present

## 2024-07-04 DIAGNOSIS — E1111 Type 2 diabetes mellitus with ketoacidosis with coma: Secondary | ICD-10-CM | POA: Diagnosis not present

## 2024-07-04 LAB — GLUCOSE, CAPILLARY
Glucose-Capillary: 378 mg/dL — ABNORMAL HIGH (ref 70–99)
Glucose-Capillary: 212 mg/dL — ABNORMAL HIGH (ref 70–99)
Glucose-Capillary: 192 mg/dL — ABNORMAL HIGH (ref 70–99)
Glucose-Capillary: 206 mg/dL — ABNORMAL HIGH (ref 70–99)

## 2024-07-04 LAB — BASIC METABOLIC PANEL WITH GFR
Anion gap: 10 (ref 5–15)
BUN: 29 mg/dL — ABNORMAL HIGH (ref 8–23)
CO2: 23 mmol/L (ref 22–32)
Calcium: 8.4 mg/dL — ABNORMAL LOW (ref 8.9–10.3)
Chloride: 108 mmol/L (ref 98–111)
Creatinine, Ser: 1.23 mg/dL — ABNORMAL HIGH (ref 0.44–1.00)
GFR, Estimated: 47 mL/min — ABNORMAL LOW
Glucose, Bld: 165 mg/dL — ABNORMAL HIGH (ref 70–99)
Potassium: 3.5 mmol/L (ref 3.5–5.1)
Sodium: 141 mmol/L (ref 135–145)

## 2024-07-04 MED ORDER — INSULIN GLARGINE 100 UNIT/ML ~~LOC~~ SOLN
30.0000 [IU] | Freq: Every day | SUBCUTANEOUS | Status: DC
  Administered 2024-07-04: 30 [IU] via SUBCUTANEOUS
  Filled 2024-07-04 (×2): qty 0.3

## 2024-07-04 MED ORDER — INSULIN ASPART 100 UNIT/ML IJ SOLN
6.0000 [IU] | Freq: Three times a day (TID) | INTRAMUSCULAR | Status: DC
  Administered 2024-07-04 – 2024-07-07 (×11): 6 [IU] via SUBCUTANEOUS
  Filled 2024-07-04 (×10): qty 6

## 2024-07-04 NOTE — Progress Notes (Signed)
 "                        PROGRESS NOTE        PATIENT DETAILS Name: Michelle Henderson Age: 68 y.o. Sex: female Date of Birth: [birth year] Admit Date: 07/01/2024 Admitting Physician Redia LOISE Cleaver, MD ERE:[id number], Erminio CROME, MD  Brief Summary: Patient is a 68 y.o.  female with history of DM-2, HTN, HLD-presented with confusion/mild RUE weakness/intermittent hand twitching-she was found to have DKA-focal seizures.  Significant events: 12/16>> admit to [facility]  Significant studies: 12/16>> CT head: No acute intracranial abnormality 12/16>> CT angio head/neck: No LVO/significant stenosis 12/16>> MRI brain: No acute intracranial abnormality 12/16>> EEG: No seizures  Significant microbiology data: None  Procedures: None  Consults: Neuro   Subjective: No major issues overnight-lying comfortably in bed.  Objective: Vitals: Blood pressure (!) 130/58, pulse 92, temperature 97.6 F (36.4 C), temperature source Oral, resp. rate (!) 24, SpO2 97%.   Exam: Gen Exam:Alert awake-not in any distress HEENT:atraumatic, normocephalic Chest: B/L clear to auscultation anteriorly CVS:S1S2 regular Abdomen:soft non tender, non distended Extremities:no edema Neurology: Non focal Skin: no rash  Pertinent Labs/Radiology:    Latest Ref Rng & Units 07/03/2024    7:37 AM 07/01/2024    5:07 AM 07/01/2024    2:58 AM  CBC  WBC 4.0 - 10.5 K/uL 5.5  8.5    Hemoglobin 12.0 - 15.0 g/dL 87.5  83.8  84.3   Hematocrit 36.0 - 46.0 % 36.0  46.7  46.0   Platelets 150 - 400 K/uL 84  183      Lab Results  Component Value Date   NA 141 07/04/2024   K 3.5 07/04/2024   CL 108 07/04/2024   CO2 23 07/04/2024      Assessment/Plan: DKA Resolved with IV fluids/IV insulin  Has been transition to SQ insulin .  DM-2 with uncontrolled hyperglycemia (A1c 16.7 on 12/16) CBGs remain elevated Increase Lantus  to 30 units+ increase Premeal NovoLog  to 6 units +continue SSI Will be new to  insulin -begin diabetic education/insulin  education   Recent Labs    07/03/24 1645 07/03/24 2038 07/04/24 0836  GLUCAP 279* 252* 378*     Focal seizure Secondary to metabolic derangements-MRI brain negative for structural abnormalities-EEG negative Per neurology recommendation-no longer on Keppra   Acute metabolic encephalopathy Related to DKA/AKI/postictal state from seizures Resolved-she is back to baseline-completely awake/alert this morning  AKI Secondary to osmotic mediated kidney injury due to DKA Resolving with IVF Doubt CKD-ruled out.  UTI No culture sent Clinically currently asymptomatic Completed course of Rocephin .  HLD Continue statin  HTN All antihypertensives held-BP remains stable Resume accordingly  Debility/deconditioning Secondary to acute illness-SNF now recommended-awaiting insurance authorization  Class 2 Obesity: Estimated body mass index is 39.26 kg/m as calculated from the following:   Height as of 07/09/22: 5' 1 (1.549 m).   Weight as of 07/09/22: 94.3 kg.   Code status:   Code Status: Full Code   DVT Prophylaxis: heparin  injection 5,000 Units Start: 07/01/24 1400   Family Communication: None at bedside   Disposition Plan: Status is: Inpatient Remains inpatient appropriate because: Severity of illness   Planned Discharge Destination: SNF-awaiting insurance authorization   Diet: Diet Order             Diet heart healthy/carb modified Fluid consistency: Thin  Diet effective now  Antimicrobial agents: Anti-infectives (From admission, onward)    Start     Dose/Rate Route Frequency Ordered Stop   07/01/24 0500  cefTRIAXone  (ROCEPHIN ) 1 g in sodium chloride  0.9 % 100 mL IVPB        1 g 200 mL/hr over 30 Minutes Intravenous Daily 07/01/24 0406          MEDICATIONS: Scheduled Meds:  atorvastatin   20 mg Oral QODAY   fluticasone   2 spray Each Nare Daily   heparin   5,000 Units Subcutaneous Q8H    insulin  aspart  0-15 Units Subcutaneous TID WC   insulin  aspart  0-5 Units Subcutaneous QHS   insulin  aspart  6 Units Subcutaneous TID WC   insulin  glargine  30 Units Subcutaneous Daily   loratadine   10 mg Oral Daily   Continuous Infusions:  cefTRIAXone  (ROCEPHIN )  IV 1 g (07/04/24 0918)   PRN Meds:.dextrose    I have personally reviewed following labs and imaging studies  LABORATORY DATA: CBC: Recent Labs  Lab 07/01/24 0057 07/01/24 0102 07/01/24 0258 07/01/24 0507 07/03/24 0737  WBC 8.9  --   --  8.5 5.5  NEUTROABS 7.3  --   --   --   --   HGB 18.2* 18.4* 15.6* 16.1* 12.4  HCT 54.4* 54.0* 46.0 46.7* 36.0  MCV 94.1  --   --  90.9 92.8  PLT 200  --   --  183 84*    Basic Metabolic Panel: Recent Labs  Lab 07/02/24 1531 07/02/24 1936 07/03/24 0007 07/03/24 0408 07/04/24 0317  NA 153* 149* 148* 140 141  K 3.8 3.6 4.0 3.5 3.5  CL 115* 113* 113* 108 108  CO2 22 26 24 22 23   GLUCOSE 175* 208* 221* 258* 165*  BUN 42* 41* 38* 35* 29*  CREATININE 1.67* 1.55* 1.45* 1.32* 1.23*  CALCIUM  9.6 9.0 8.6* 8.3* 8.4*    GFR: CrCl cannot be calculated (Unknown ideal weight.).  Liver Function Tests: Recent Labs  Lab 07/01/24 0057 07/03/24 0408  AST 44* 27  ALT 90* 46*  ALKPHOS 165* 108  BILITOT 2.4* 0.7  PROT 7.3 4.8*  ALBUMIN 3.7 2.8*   No results for input(s): LIPASE, AMYLASE in the last 168 hours. No results for input(s): AMMONIA in the last 168 hours.  Coagulation Profile: Recent Labs  Lab 07/01/24 0057  INR 1.2    Cardiac Enzymes: No results for input(s): CKTOTAL, CKMB, CKMBINDEX, TROPONINI in the last 168 hours.  BNP (last 3 results) No results for input(s): PROBNP in the last 8760 hours.  Lipid Profile: No results for input(s): CHOL, HDL, LDLCALC, TRIG, CHOLHDL, LDLDIRECT in the last 72 hours.  Thyroid  Function Tests: Recent Labs    07/02/24 0747  TSH 0.512    Anemia Panel: No results for input(s):  VITAMINB12, FOLATE, FERRITIN, TIBC, IRON, RETICCTPCT in the last 72 hours.  Urine analysis:    Component Value Date/Time   COLORURINE YELLOW 07/01/2024 0224   APPEARANCEUR HAZY (A) 07/01/2024 0224   LABSPEC 1.027 07/01/2024 0224   PHURINE 5.0 07/01/2024 0224   GLUCOSEU >=500 (A) 07/01/2024 0224   HGBUR SMALL (A) 07/01/2024 0224   BILIRUBINUR NEGATIVE 07/01/2024 0224   KETONESUR 20 (A) 07/01/2024 0224   PROTEINUR 30 (A) 07/01/2024 0224   NITRITE NEGATIVE 07/01/2024 0224   LEUKOCYTESUR TRACE (A) 07/01/2024 0224    Sepsis Labs: Lactic Acid, Venous    Component Value Date/Time   LATICACIDVEN 3.9 (HH) 07/01/2024 0528    MICROBIOLOGY: No results found for this  or any previous visit (from the past 240 hours).  RADIOLOGY STUDIES/RESULTS: No results found.    LOS: 3 days   Donalda Applebaum, MD  [facility]    To contact the attending provider between 7A-7P or the covering provider during after hours 7P-7A, please log into the web site [email or web address] and access using universal [place] password for that web site. If you do not have the password, please call the hospital operator.  07/04/2024, 11:02 AM    "

## 2024-07-04 NOTE — TOC Progression Note (Addendum)
 Transition of Care [facility]) - Progression Note    Patient Details  Name: Michelle Henderson MRN: [id number] Date of Birth: 06/12/1956  [facility] CM/SW Contact  [name], LCSW Phone Number: 07/04/2024, 3:11 PM  Clinical Narrative:    CSW met with patient, her cousin, Michelle Henderson, and Debra's spouse, Michelle Henderson, at bedside with verbal consent. Patient stated she would like to choose [facility] but Michelle Henderson was not able to get a tour scheduled until tomorrow. Michelle Henderson stated she can transport patient by car at discharge.  CSW will initiate insurance process and awaiting response from [facility] to see if they do have a bed available.   CSW confirmed [facility] can accept tomorrow pending insurance authorization. CW initiated insurance process, Ref # [id number].    Expected Discharge Plan: Skilled Nursing Facility Barriers to Discharge: Insurance Authorization, SNF Pending bed offer               Expected Discharge Plan and Services       Living arrangements for the past 2 months: Single Family Home                                       Social Drivers of Health (SDOH) Interventions SDOH Screenings   Food Insecurity: No Food Insecurity (12/04/2023)   Received [facility]  Housing: Low Risk (12/04/2023)   Received from [facility]  Transportation Needs: No Transportation Needs (12/04/2023)   Received from [facility]  Utilities: Not At Risk (12/04/2023)   Received from [facility]  Financial Resource Strain: Low Risk (12/04/2023)   Received from [facility]  Physical Activity: Insufficiently Active (12/04/2023)   Received from [facility]  Social Connections: Moderately Integrated (12/04/2023)   Received from [facility]  Stress: No Stress Concern Present (12/04/2023)   Received from [facility]  Tobacco Use: Low Risk (07/01/2024)    Readmission Risk Interventions     No data to display

## 2024-07-04 NOTE — Inpatient Diabetes Management (Signed)
 Inpatient Diabetes Program Recommendations  AACE/ADA: New Consensus Statement on Inpatient Glycemic Control (2015)  Target Ranges:  Prepandial:   less than 140 mg/dL      Peak postprandial:   less than 180 mg/dL (1-2 hours)      Critically ill patients:  140 - 180 mg/dL    Latest Reference Range & Units 07/03/24 08:14 07/03/24 12:22 07/03/24 16:45 07/03/24 20:38  Glucose-Capillary 70 - 99 mg/dL 745 (H) 740 (H) 720 (H) 252 (H)  (H): Data is abnormally high   Latest Reference Range & Units 07/04/24 08:36  Glucose-Capillary 70 - 99 mg/dL 621 (H)  (H): Data is abnormally high  Admit with: DKA   History: DM2   Home DM Meds: Metformin 500 mg BID                             Ozempic 2 mg Qweek   Current Orders: Lantus  30 units daily                            Novolog  Moderate Correction Scale/ SSI (0-15 units) TID AC + HS                            Novolog  6 units TID with meals         PCP: Dr. Erminio Sensing with [facility] Last Seen 12/07/2023 A1c at that visit was 6.4%     Met w/ pt and her cousin Marval again today.  Pt continues to be much more alert and participative today.    Plan is for SNF for Rehab--Cousin researching facilities.  Helped pt to download the [place] app on her phone with cousin supervising.  Asked Cousin to please help pt set the app up so she can use the Freestyle [place] 3 CGM when she goes home from [place].  Verbally explained how to use FSL3 and Websites attached to AVS.  Cousin will help pt start 1st sensor when she eventually goes home.  2 samples Sensors given to Cousin and reminded pt and Cousin of the websites they can access for learning to start 1st sensor at home.  Pt still aware she will need insulin  for home and willing to do so.  She as educated on using insulin  pen yesterday and did well.  Reviewed healthy CBG goals for home with pt and cousin.         --Will follow patient during hospitalization--  Adina Rudolpho Arrow RN, MSN, CDCES Diabetes Coordinator Inpatient Glycemic Control Team Team Pager: [phone] (8a-5p)

## 2024-07-04 NOTE — Care Management Important Message (Signed)
 Important Message  Patient Details  Name: Michelle Henderson MRN: [id number] Date of Birth: Jun 14, 1956   Important Message Given:  Yes - Medicare IM     Claretta Deed 07/04/2024, 2:05 PM

## 2024-07-04 NOTE — Plan of Care (Signed)
   Problem: Metabolic: Goal: Ability to maintain appropriate glucose levels will improve Outcome: Progressing

## 2024-07-04 NOTE — Progress Notes (Signed)
" °   07/04/24 1355  Spiritual Encounters  Type of Visit Initial  Care provided to: Pt and family  Reason for visit Advance directives  OnCall Visit No    Chaplain responded to spiritual care consult for advance directives. Chaplain facilitated notarization. Documents completed and uploaded to [email or web address]. Original left with pt Pattie.  In addition, chaplain provided emotional support via reflective listening and compassionate presence. Arzella Perry and Debbie's husband Lynwood bedside.  All are aware chaplains continue to remain available as needs arise. "

## 2024-07-04 NOTE — Plan of Care (Signed)
" °  Problem: Coping: Goal: Ability to adjust to condition or change in health will improve Outcome: Progressing   Problem: Fluid Volume: Goal: Ability to maintain a balanced intake and output will improve Outcome: Progressing   Problem: Health Behavior/Discharge Planning: Goal: Ability to manage health-related needs will improve Outcome: Progressing   Problem: Metabolic: Goal: Ability to maintain appropriate glucose levels will improve Outcome: Progressing   Problem: Nutritional: Goal: Maintenance of adequate nutrition will improve Outcome: Progressing   Problem: Education: Goal: Ability to describe self-care measures that may prevent or decrease complications (Diabetes Survival Skills Education) will improve Outcome: Progressing   Problem: Metabolic: Goal: Ability to maintain appropriate glucose levels will improve Outcome: Progressing   "

## 2024-07-05 DIAGNOSIS — N179 Acute kidney failure, unspecified: Secondary | ICD-10-CM | POA: Diagnosis not present

## 2024-07-05 DIAGNOSIS — G9340 Encephalopathy, unspecified: Secondary | ICD-10-CM | POA: Diagnosis not present

## 2024-07-05 DIAGNOSIS — R569 Unspecified convulsions: Secondary | ICD-10-CM | POA: Diagnosis not present

## 2024-07-05 DIAGNOSIS — E1111 Type 2 diabetes mellitus with ketoacidosis with coma: Secondary | ICD-10-CM | POA: Diagnosis not present

## 2024-07-05 LAB — GLUCOSE, CAPILLARY
Glucose-Capillary: 161 mg/dL — ABNORMAL HIGH (ref 70–99)
Glucose-Capillary: 216 mg/dL — ABNORMAL HIGH (ref 70–99)
Glucose-Capillary: 153 mg/dL — ABNORMAL HIGH (ref 70–99)
Glucose-Capillary: 114 mg/dL — ABNORMAL HIGH (ref 70–99)

## 2024-07-05 MED ORDER — INSULIN GLARGINE 100 UNIT/ML ~~LOC~~ SOLN
34.0000 [IU] | Freq: Every day | SUBCUTANEOUS | Status: DC
  Administered 2024-07-05 – 2024-07-07 (×3): 34 [IU] via SUBCUTANEOUS
  Filled 2024-07-05 (×3): qty 0.34

## 2024-07-05 NOTE — TOC Progression Note (Addendum)
 Transition of Care [facility]) - Progression Note    Patient Details  Name: Michelle Henderson MRN: [id number] Date of Birth: 1955/12/28  Transition [facility] CM/SW Contact  Shalae Belmonte [place], [place] Phone Number: 07/05/2024, 9:32 AM  Clinical Narrative:     Phone call to [facility], spoke with [place] to check status of insurance authorization for admission. Insurance authorization still pending. If authorization comes back today, patient can admit tomorrow.  Nathalie Cavendish, LCSW Transition of Care    Expected Discharge Plan: Skilled Nursing Facility Barriers to Discharge: Insurance Authorization, SNF Pending bed offer               Expected Discharge Plan and Services       Living arrangements for the past 2 months: Single Family Home                                       Social Drivers of Health (SDOH) Interventions SDOH Screenings   Food Insecurity: No Food Insecurity (07/04/2024)  Housing: High Risk (07/04/2024)  Transportation Needs: No Transportation Needs (07/04/2024)  Utilities: Not At Risk (07/04/2024)  Financial Resource Strain: Low Risk (12/04/2023)   Received from [facility]  Physical Activity: Insufficiently Active (12/04/2023)   Received from [facility]  Social Connections: Moderately Isolated (07/04/2024)  Stress: No Stress Concern Present (12/04/2023)   Received from [facility]  Tobacco Use: Low Risk (07/01/2024)    Readmission Risk Interventions     No data to display

## 2024-07-05 NOTE — Progress Notes (Signed)
 Physical Therapy Treatment Patient Details Name: Michelle Henderson MRN: [id number] DOB: 01-Feb-1956 Today's Date: 07/05/2024   History of Present Illness 68 year old presented to [facility] on  07/01/24 for aphasia and mild RUE weaknss and rt hand intermittent twitching. Pt with DKA possible seizure and acute encephalopathy.  PMH - DM, HTN, obesity.    PT Comments  Pt demonstrates mild increase in SOB with functional mobility this session. Seated and standing rest breaks taken throughout session to manage SOB and fatigue. Pt ambulated short distance in room to bathroom without DME upon arrival, and was mildly unsteady throughout. Pt requires minA to stand from low toilet due to fatigue and LE weakness. Pt continues to tolerate increased total distance of ambulation, though requires increased rest breaks today. Seated and standing strength and balance exercises performed at end of session, which pt tolerated well. Pt would benefit from continued PT services focused on balance, gait, and breathing techniques with pacing to promote tolerance to functional activity.  Pt does not have the activity to[place] at this time to safely perform IADLs or ambulate at home and lives alone.    If plan is discharge home, recommend the following: A little help with walking and/or transfers;A little help with bathing/dressing/bathroom;Assistance with cooking/housework;Assist for transportation;Help with stairs or ramp for entrance   Can travel by private vehicle     Yes  Equipment Recommendations  Rolling walker (2 wheels);BSC/3in1    Recommendations for Other Services       Precautions / Restrictions Precautions Precautions: Fall Recall of Precautions/Restrictions: Intact Restrictions Weight Bearing Restrictions Per Provider Order: No     Mobility  Bed Mobility               General bed mobility comments: Pt standing at sink upon arrival.    Transfers Overall transfer level: Needs  assistance Equipment used: Rolling walker (2 wheels) Transfers: Sit to/from Stand Sit to Stand: Contact guard assist, Min assist           General transfer comment: STS x6 completed throughout session, pt requiring CGA from recliner surface. MinA required to stand from standard toilet height. Pt demonstrates good balance upon standing with rolling walker.    Ambulation/Gait Ambulation/Gait assistance: Contact guard assist Gait Distance (Feet): 70 Feet (70'x2, 40'x2, multiple standing rest breaks taken) Assistive device: Rolling walker (2 wheels) Gait Pattern/deviations: Step-through pattern, Decreased step length - right, Decreased step length - left, Decreased dorsiflexion - left   Gait velocity interpretation: 1.31 - 2.62 ft/sec, indicative of limited community ambulator   General Gait Details: Decreased L foot clearance with onset of fatigue. Multiple standing rest breaks taken while ambulating due to SOB. Pt fatigues quickly with multiple bouts of short distance ambulation. Pt ambulated from sink to bathroom without walker and was unsteady throughout, reaching out for the wall to steady self until seated.   Stairs             Wheelchair Mobility     Tilt Bed    Modified Rankin (Stroke Patients Only)       Balance Overall balance assessment: Mild deficits observed, not formally tested Sitting-balance support: No upper extremity supported Sitting balance-Leahy Scale: Good Sitting balance - Comments: No sitting balance concerns   Standing balance support: Bilateral upper extremity supported Standing balance-Leahy Scale: Good Standing balance comment: No LOB noted during session. Increased risk for LOB with decraesed toe clearance on L with onset of fatigue. Benefits from use of walker for balance and activity tolerance.  Pt with increased postural sway with narrow BOS during static balance tasks.     Tandem Stance - Right Leg: 15 Tandem Stance - Left Leg:  20 Rhomberg - Eyes Opened: 30 Rhomberg - Eyes Closed: 30                Communication Communication Communication: No apparent difficulties  Cognition Arousal: Alert Behavior During Therapy: WFL for tasks assessed/performed   PT - Cognitive impairments: No apparent impairments                         Following commands: Intact      Cueing Cueing Techniques: Verbal cues, Tactile cues, Visual cues  Exercises General Exercises - Lower Extremity Ankle Circles/Pumps: AROM, Strengthening, Both, 10 reps, Seated Long Arc Quad: AROM, Strengthening, Both, 10 reps, Seated Hip ABduction/ADduction: AROM, Strengthening, Both, 10 reps, Seated Hip Flexion/Marching: AROM, Strengthening, Both, 10 reps, Seated    General Comments General comments (skin integrity, edema, etc.): VSS throughout, no new skin abnormalities noted.      Pertinent Vitals/Pain Pain Assessment Pain Assessment: No/denies pain    Home Living                          Prior Function            PT Goals (current goals can now be found in the care plan section) Acute Rehab PT Goals Patient Stated Goal: No new goals stated during session Progress towards PT goals: Progressing toward goals    Frequency    Min 2X/week      PT Plan      Co-evaluation              AM-PAC PT 6 Clicks Mobility   Outcome Measure  Help needed turning from your back to your side while in a flat bed without using bedrails?: None Help needed moving from lying on your back to sitting on the side of a flat bed without using bedrails?: A Little Help needed moving to and from a bed to a chair (including a wheelchair)?: A Little Help needed standing up from a chair using your arms (e.g., wheelchair or bedside chair)?: A Little Help needed to walk in hospital room?: A Little Help needed climbing 3-5 steps with a railing? : A Lot 6 Click Score: 18    End of Session   Activity Tolerance: Patient  limited by fatigue (Pt limited by SOB) Patient left: in chair;with call bell/phone within reach Nurse Communication: Mobility status PT Visit Diagnosis: Unsteadiness on feet (R26.81);Muscle weakness (generalized) (M62.81)     Time: 9144-9078 PT Time Calculation (min) (ACUTE ONLY): 26 min  Charges:    $Gait Training: 8-22 mins $Therapeutic Exercise: 8-22 mins PT General Charges $$ ACUTE PT VISIT: 1 Visit                     Sabra Morel, PT, DPT  Acute Rehabilitation Services         Office: [phone]      Sabra MARLA Morel 07/05/2024, 3:46 PM

## 2024-07-05 NOTE — Plan of Care (Signed)

## 2024-07-05 NOTE — Progress Notes (Signed)
 "                        PROGRESS NOTE        PATIENT DETAILS Name: Michelle Henderson Age: 68 y.o. Sex: female Date of Birth: 29-Oct-1955 Admit Date: 07/01/2024 Admitting Physician Redia LOISE Cleaver, MD ERE:[email or web address], Erminio CROME, MD  Brief Summary: Patient is a 68 y.o.  female with history of DM-2, HTN, HLD-presented with confusion/mild RUE weakness/intermittent hand twitching-she was found to have DKA-focal seizures.  Significant events: 12/16>> admit to [facility]  Significant studies: 12/16>> CT head: No acute intracranial abnormality 12/16>> CT angio head/neck: No LVO/significant stenosis 12/16>> MRI brain: No acute intracranial abnormality 12/16>> EEG: No seizures  Significant microbiology data: None  Procedures: None  Consults: Neuro   Subjective: No major issues overnight-awaiting SNF bed.  Objective: Vitals: Blood pressure (!) 142/72, pulse 75, temperature 97.7 F (36.5 C), temperature source Oral, resp. rate (!) 21, height 5' 2 (1.575 m), weight 97.1 kg, SpO2 96%.   Exam: Awake/alert Nonfocal exam Trace pedal edema  Pertinent Labs/Radiology:    Latest Ref Rng & Units 07/03/2024    7:37 AM 07/01/2024    5:07 AM 07/01/2024    2:58 AM  CBC  WBC 4.0 - 10.5 K/uL 5.5  8.5    Hemoglobin 12.0 - 15.0 g/dL 87.5  83.8  84.3   Hematocrit 36.0 - 46.0 % 36.0  46.7  46.0   Platelets 150 - 400 K/uL 84  183      Lab Results  Component Value Date   NA 141 07/04/2024   K 3.5 07/04/2024   CL 108 07/04/2024   CO2 23 07/04/2024      Assessment/Plan: DKA Resolved with IV fluids/IV insulin  Has been transition to SQ insulin .  DM-2 with uncontrolled hyperglycemia (A1c 16.7 on 12/16) CBG stabilizing Increase Lantus  to 34 units+ continue Premeal NovoLog  6 units+ SSI Diabetic education/insulin  teaching has been completed by diabetic coordinator.  Recent Labs    07/04/24 1749 07/04/24 2105 07/05/24 0808  GLUCAP 192* 206* 161*     Focal seizure Secondary to  metabolic derangements-MRI brain negative for structural abnormalities-EEG negative Per neurology recommendation-no longer on Keppra   Acute metabolic encephalopathy Related to DKA/AKI/postictal state from seizures Resolved-she is back to baseline-completely awake/alert this morning  AKI Secondary to osmotic mediated kidney injury due to DKA Resolving with IVF Doubt CKD-ruled out.  UTI No culture sent Clinically currently asymptomatic Completed course of Rocephin .  HLD Continue statin  HTN All antihypertensives held-BP remains stable Resume accordingly  Debility/deconditioning Secondary to acute illness-SNF now recommended-awaiting insurance authorization  Class 2 Obesity: Estimated body mass index is 39.14 kg/m as calculated from the following:   Height as of this encounter: 5' 2 (1.575 m).   Weight as of this encounter: 97.1 kg.   Code status:   Code Status: Full Code   DVT Prophylaxis: heparin  injection 5,000 Units Start: 07/01/24 1400   Family Communication: None at bedside   Disposition Plan: Status is: Inpatient Remains inpatient appropriate because: Severity of illness   Planned Discharge Destination: SNF-awaiting insurance authorization   Diet: Diet Order             Diet heart healthy/carb modified Fluid consistency: Thin  Diet effective now                     Antimicrobial agents: Anti-infectives (From admission, onward)    Start     Dose/Rate Route Frequency  Ordered Stop   07/01/24 0500  cefTRIAXone  (ROCEPHIN ) 1 g in sodium chloride  0.9 % 100 mL IVPB  Status:  Discontinued        1 g 200 mL/hr over 30 Minutes Intravenous Daily 07/01/24 0406 07/04/24 1110        MEDICATIONS: Scheduled Meds:  atorvastatin   20 mg Oral QODAY   fluticasone   2 spray Each Nare Daily   heparin   5,000 Units Subcutaneous Q8H   insulin  aspart  0-15 Units Subcutaneous TID WC   insulin  aspart  0-5 Units Subcutaneous QHS   insulin  aspart  6 Units  Subcutaneous TID WC   insulin  glargine  34 Units Subcutaneous Daily   loratadine   10 mg Oral Daily   Continuous Infusions:   PRN Meds:.dextrose    I have personally reviewed following labs and imaging studies  LABORATORY DATA: CBC: Recent Labs  Lab 07/01/24 0057 07/01/24 0102 07/01/24 0258 07/01/24 0507 07/03/24 0737  WBC 8.9  --   --  8.5 5.5  NEUTROABS 7.3  --   --   --   --   HGB 18.2* 18.4* 15.6* 16.1* 12.4  HCT 54.4* 54.0* 46.0 46.7* 36.0  MCV 94.1  --   --  90.9 92.8  PLT 200  --   --  183 84*    Basic Metabolic Panel: Recent Labs  Lab 07/02/24 1531 07/02/24 1936 07/03/24 0007 07/03/24 0408 07/04/24 0317  NA 153* 149* 148* 140 141  K 3.8 3.6 4.0 3.5 3.5  CL 115* 113* 113* 108 108  CO2 22 26 24 22 23   GLUCOSE 175* 208* 221* 258* 165*  BUN 42* 41* 38* 35* 29*  CREATININE 1.67* 1.55* 1.45* 1.32* 1.23*  CALCIUM  9.6 9.0 8.6* 8.3* 8.4*    GFR: Estimated Creatinine Clearance: 46.3 mL/min (A) (by C-G formula based on SCr of 1.23 mg/dL (H)).  Liver Function Tests: Recent Labs  Lab 07/01/24 0057 07/03/24 0408  AST 44* 27  ALT 90* 46*  ALKPHOS 165* 108  BILITOT 2.4* 0.7  PROT 7.3 4.8*  ALBUMIN 3.7 2.8*   No results for input(s): LIPASE, AMYLASE in the last 168 hours. No results for input(s): AMMONIA in the last 168 hours.  Coagulation Profile: Recent Labs  Lab 07/01/24 0057  INR 1.2    Cardiac Enzymes: No results for input(s): CKTOTAL, CKMB, CKMBINDEX, TROPONINI in the last 168 hours.  BNP (last 3 results) No results for input(s): PROBNP in the last 8760 hours.  Lipid Profile: No results for input(s): CHOL, HDL, LDLCALC, TRIG, CHOLHDL, LDLDIRECT in the last 72 hours.  Thyroid  Function Tests: No results for input(s): TSH, T4TOTAL, FREET4, T3FREE, THYROIDAB in the last 72 hours.   Anemia Panel: No results for input(s): VITAMINB12, FOLATE, FERRITIN, TIBC, IRON, RETICCTPCT in the last 72  hours.  Urine analysis:    Component Value Date/Time   COLORURINE YELLOW 07/01/2024 0224   APPEARANCEUR HAZY (A) 07/01/2024 0224   LABSPEC 1.027 07/01/2024 0224   PHURINE 5.0 07/01/2024 0224   GLUCOSEU >=500 (A) 07/01/2024 0224   HGBUR SMALL (A) 07/01/2024 0224   BILIRUBINUR NEGATIVE 07/01/2024 0224   KETONESUR 20 (A) 07/01/2024 0224   PROTEINUR 30 (A) 07/01/2024 0224   NITRITE NEGATIVE 07/01/2024 0224   LEUKOCYTESUR TRACE (A) 07/01/2024 0224    Sepsis Labs: Lactic Acid, Venous    Component Value Date/Time   LATICACIDVEN 3.9 (HH) 07/01/2024 0528    MICROBIOLOGY: No results found for this or any previous visit (from the past 240 hours).  RADIOLOGY STUDIES/RESULTS: No results found.    LOS: 4 days   Donalda Applebaum, MD  [facility]    To contact the attending provider between 7A-7P or the covering provider during after hours 7P-7A, please log into the web site [email or web address] and access using universal [place] password for that web site. If you do not have the password, please call the hospital operator.  07/05/2024, 10:02 AM    "

## 2024-07-06 DIAGNOSIS — G9340 Encephalopathy, unspecified: Secondary | ICD-10-CM | POA: Diagnosis not present

## 2024-07-06 DIAGNOSIS — R569 Unspecified convulsions: Secondary | ICD-10-CM | POA: Diagnosis not present

## 2024-07-06 DIAGNOSIS — N179 Acute kidney failure, unspecified: Secondary | ICD-10-CM | POA: Diagnosis not present

## 2024-07-06 DIAGNOSIS — E1111 Type 2 diabetes mellitus with ketoacidosis with coma: Secondary | ICD-10-CM | POA: Diagnosis not present

## 2024-07-06 LAB — GLUCOSE, CAPILLARY
Glucose-Capillary: 110 mg/dL — ABNORMAL HIGH (ref 70–99)
Glucose-Capillary: 208 mg/dL — ABNORMAL HIGH (ref 70–99)
Glucose-Capillary: 164 mg/dL — ABNORMAL HIGH (ref 70–99)
Glucose-Capillary: 156 mg/dL — ABNORMAL HIGH (ref 70–99)

## 2024-07-06 NOTE — Plan of Care (Signed)

## 2024-07-06 NOTE — Progress Notes (Signed)
 "                        PROGRESS NOTE        PATIENT DETAILS Name: Michelle Henderson Age: 68 y.o. Sex: female Date of Birth: 1955-12-05 Admit Date: 07/01/2024 Admitting Physician Redia LOISE Cleaver, MD ERE:[id number], Erminio CROME, MD  Brief Summary: Patient is a 68 y.o.  female with history of DM-2, HTN, HLD-presented with confusion/mild RUE weakness/intermittent hand twitching-she was found to have DKA-focal seizures.  Significant events: 12/16>> admit to [facility]  Significant studies: 12/16>> CT head: No acute intracranial abnormality 12/16>> CT angio head/neck: No LVO/significant stenosis 12/16>> MRI brain: No acute intracranial abnormality 12/16>> EEG: No seizures  Significant microbiology data: None  Procedures: None  Consults: Neuro   Subjective: No major issues overnight-lying comfortably in bed.  Objective: Vitals: Blood pressure 114/73, pulse 82, temperature 97.9 F (36.6 C), temperature source Oral, resp. rate 18, height 5' 2 (1.575 m), weight 97.1 kg, SpO2 96%.   Exam: Awake/alert Nonfocal exam Trace pedal edema  Pertinent Labs/Radiology:    Latest Ref Rng & Units 07/03/2024    7:37 AM 07/01/2024    5:07 AM 07/01/2024    2:58 AM  CBC  WBC 4.0 - 10.5 K/uL 5.5  8.5    Hemoglobin 12.0 - 15.0 g/dL 87.5  83.8  84.3   Hematocrit 36.0 - 46.0 % 36.0  46.7  46.0   Platelets 150 - 400 K/uL 84  183      Lab Results  Component Value Date   NA 141 07/04/2024   K 3.5 07/04/2024   CL 108 07/04/2024   CO2 23 07/04/2024      Assessment/Plan: DKA Resolved with IV fluids/IV insulin  Has been transition to SQ insulin .  DM-2 with uncontrolled hyperglycemia (A1c 16.7 on 12/16) CBG controlled Lantus  34 units+ 6 units of Premeal NovoLog + SSI Diabetic education/insulin  teaching has been completed by diabetic coordinator.  Recent Labs    07/05/24 1644 07/05/24 2129 07/06/24 0816  GLUCAP 153* 114* 110*     Focal seizure Secondary to metabolic  derangements-MRI brain negative for structural abnormalities-EEG negative Per neurology recommendation-no longer on Keppra   Acute metabolic encephalopathy Related to DKA/AKI/postictal state from seizures Resolved-she is back to baseline-completely awake/alert this morning  AKI Secondary to osmotic mediated kidney injury due to DKA Resolving with IVF Doubt CKD-ruled out.  UTI No culture sent Clinically currently asymptomatic Completed course of Rocephin .  HLD Continue statin  HTN All antihypertensives held-BP remains stable Resume accordingly  Debility/deconditioning Secondary to acute illness-SNF now recommended-awaiting insurance authorization  Class 2 Obesity: Estimated body mass index is 39.14 kg/m as calculated from the following:   Height as of this encounter: 5' 2 (1.575 m).   Weight as of this encounter: 97.1 kg.   Code status:   Code Status: Full Code   DVT Prophylaxis: heparin  injection 5,000 Units Start: 07/01/24 1400   Family Communication: None at bedside   Disposition Plan: Status is: Inpatient Remains inpatient appropriate because: Severity of illness   Planned Discharge Destination: SNF-awaiting insurance authorization   Diet: Diet Order             Diet heart healthy/carb modified Fluid consistency: Thin  Diet effective now                     Antimicrobial agents: Anti-infectives (From admission, onward)    Start     Dose/Rate Route Frequency Ordered Stop  07/01/24 0500  cefTRIAXone  (ROCEPHIN ) 1 g in sodium chloride  0.9 % 100 mL IVPB  Status:  Discontinued        1 g 200 mL/hr over 30 Minutes Intravenous Daily 07/01/24 0406 07/04/24 1110        MEDICATIONS: Scheduled Meds:  atorvastatin   20 mg Oral QODAY   fluticasone   2 spray Each Nare Daily   heparin   5,000 Units Subcutaneous Q8H   insulin  aspart  0-15 Units Subcutaneous TID WC   insulin  aspart  0-5 Units Subcutaneous QHS   insulin  aspart  6 Units Subcutaneous TID  WC   insulin  glargine  34 Units Subcutaneous Daily   loratadine   10 mg Oral Daily   Continuous Infusions:   PRN Meds:.dextrose    I have personally reviewed following labs and imaging studies  LABORATORY DATA: CBC: Recent Labs  Lab 07/01/24 0057 07/01/24 0102 07/01/24 0258 07/01/24 0507 07/03/24 0737  WBC 8.9  --   --  8.5 5.5  NEUTROABS 7.3  --   --   --   --   HGB 18.2* 18.4* 15.6* 16.1* 12.4  HCT 54.4* 54.0* 46.0 46.7* 36.0  MCV 94.1  --   --  90.9 92.8  PLT 200  --   --  183 84*    Basic Metabolic Panel: Recent Labs  Lab 07/02/24 1531 07/02/24 1936 07/03/24 0007 07/03/24 0408 07/04/24 0317  NA 153* 149* 148* 140 141  K 3.8 3.6 4.0 3.5 3.5  CL 115* 113* 113* 108 108  CO2 22 26 24 22 23   GLUCOSE 175* 208* 221* 258* 165*  BUN 42* 41* 38* 35* 29*  CREATININE 1.67* 1.55* 1.45* 1.32* 1.23*  CALCIUM  9.6 9.0 8.6* 8.3* 8.4*    GFR: Estimated Creatinine Clearance: 46.3 mL/min (A) (by C-G formula based on SCr of 1.23 mg/dL (H)).  Liver Function Tests: Recent Labs  Lab 07/01/24 0057 07/03/24 0408  AST 44* 27  ALT 90* 46*  ALKPHOS 165* 108  BILITOT 2.4* 0.7  PROT 7.3 4.8*  ALBUMIN 3.7 2.8*   No results for input(s): LIPASE, AMYLASE in the last 168 hours. No results for input(s): AMMONIA in the last 168 hours.  Coagulation Profile: Recent Labs  Lab 07/01/24 0057  INR 1.2    Cardiac Enzymes: No results for input(s): CKTOTAL, CKMB, CKMBINDEX, TROPONINI in the last 168 hours.  BNP (last 3 results) No results for input(s): PROBNP in the last 8760 hours.  Lipid Profile: No results for input(s): CHOL, HDL, LDLCALC, TRIG, CHOLHDL, LDLDIRECT in the last 72 hours.  Thyroid  Function Tests: No results for input(s): TSH, T4TOTAL, FREET4, T3FREE, THYROIDAB in the last 72 hours.   Anemia Panel: No results for input(s): VITAMINB12, FOLATE, FERRITIN, TIBC, IRON, RETICCTPCT in the last 72 hours.  Urine  analysis:    Component Value Date/Time   COLORURINE YELLOW 07/01/2024 0224   APPEARANCEUR HAZY (A) 07/01/2024 0224   LABSPEC 1.027 07/01/2024 0224   PHURINE 5.0 07/01/2024 0224   GLUCOSEU >=500 (A) 07/01/2024 0224   HGBUR SMALL (A) 07/01/2024 0224   BILIRUBINUR NEGATIVE 07/01/2024 0224   KETONESUR 20 (A) 07/01/2024 0224   PROTEINUR 30 (A) 07/01/2024 0224   NITRITE NEGATIVE 07/01/2024 0224   LEUKOCYTESUR TRACE (A) 07/01/2024 0224    Sepsis Labs: Lactic Acid, Venous    Component Value Date/Time   LATICACIDVEN 3.9 (HH) 07/01/2024 0528    MICROBIOLOGY: No results found for this or any previous visit (from the past 240 hours).  RADIOLOGY STUDIES/RESULTS: No results  found.    LOS: 5 days   Donalda Applebaum, MD  [facility]    To contact the attending provider between 7A-7P or the covering provider during after hours 7P-7A, please log into the web site [email or web address] and access using universal [place] password for that web site. If you do not have the password, please call the hospital operator.  07/06/2024, 10:21 AM    "

## 2024-07-07 LAB — GLUCOSE, CAPILLARY
Glucose-Capillary: 238 mg/dL — ABNORMAL HIGH (ref 70–99)
Glucose-Capillary: 160 mg/dL — ABNORMAL HIGH (ref 70–99)

## 2024-07-07 MED ORDER — NOVOLOG FLEXPEN 100 UNIT/ML ~~LOC~~ SOPN: PEN_INJECTOR | SUBCUTANEOUS | Status: AC

## 2024-07-07 MED ORDER — INSULIN ASPART 100 UNIT/ML IJ SOLN: 6.0000 [IU] | Freq: Three times a day (TID) | INTRAMUSCULAR | Status: AC

## 2024-07-07 MED ORDER — INSULIN GLARGINE 100 UNIT/ML ~~LOC~~ SOLN: 34.0000 [IU] | Freq: Every day | SUBCUTANEOUS | Status: AC

## 2024-07-07 NOTE — Progress Notes (Signed)
 Pt to be discharged to SNF via private transport by cousin. Report called to RN Nyesha at accepting facility prior to pt leaving floor. AVS printed and education completed by discharge nurse. Pts IV removed and tele disconnected prior to discharge. Pts belongings gathered and pt changed into regular clothing for transportation. Pt transported off unit via wheelchair and to be driven to [facility] by cousin.

## 2024-07-07 NOTE — TOC Transition Note (Signed)
 Transition of Care [facility]) - Discharge Note   Patient Details  Name: Michelle Henderson MRN: [id number] Date of Birth: 04-May-1956  [facility] CM/SW Contact:  [name], LCSW Phone Number: 07/07/2024, 12:39 PM   Clinical Narrative:    Patient will DC to: [facility] Anticipated DC date: 07/07/24 Family notified: Michelle Henderson at bedside Transport by: car   Per MD patient ready for DC to [place]. RN to call report prior to discharge [phone] room 601). RN, patient, patient's family, and facility notified of DC. Discharge Summary and FL2 sent to facility.   CSW will sign off for now as social work intervention is no longer needed. Please consult us  again if new needs arise.     Final next level of care: Skilled Nursing Facility Barriers to Discharge: Barriers Resolved   Patient Goals and CMS Choice Patient states their goals for this hospitalization and ongoing recovery are:: Rehab CMS Medicare.gov Compare Post Acute Care list provided to:: Patient Choice offered to / list presented to : Patient (and cousin)  ownership interest in [facility].provided to:: Patient    Discharge Placement   Existing PASRR number confirmed : 07/07/24          Patient chooses bed at: [facility] Patient to be transferred to facility by: car Name of family member notified: Michelle Henderson Patient and family notified of of transfer: 07/07/24  Discharge Plan and Services Additional resources added to the After Visit Summary for   In-house Referral: Clinical Social Work Discharge Planning Services: CM Consult Post Acute Care Choice: Skilled Nursing Facility                               Social Drivers of Health (SDOH) Interventions SDOH Screenings   Food Insecurity: No Food Insecurity (07/04/2024)  Housing: High Risk (07/04/2024)  Transportation Needs: No Transportation Needs (07/04/2024)  Utilities: Not At Risk (07/04/2024)  Financial Resource  Strain: Low Risk (12/04/2023)   Received from [facility]  Physical Activity: Insufficiently Active (12/04/2023)   Received from [facility]  Social Connections: Moderately Isolated (07/04/2024)  Stress: No Stress Concern Present (12/04/2023)   Received from [facility]  Tobacco Use: Low Risk (07/01/2024)     Readmission Risk Interventions     No data to display

## 2024-07-07 NOTE — TOC Progression Note (Addendum)
 Transition of Care [facility]) - Progression Note    Patient Details  Name: Michelle Henderson MRN: [id number] Date of Birth: 05/25/1956  Transition of Care [facility]) CM/SW Contact  [name], LCSW Phone Number: 07/07/2024, 8:45 AM  Clinical Narrative:    8:45 AM-Insurance approval still pending. CSW uploaded most updated therapy note to the portal.   12:22 PM-Per [place], insurance has approved patient. Ref# [id number] ID# [id number], effective 07/05/2024-07/07/2024. CSW updated patient's cousin, Adrien, who is at bedside, and explained how insurance process will work at [facility]. She reported being comfortable taking patient by car.    Expected Discharge Plan: Skilled Nursing Facility Barriers to Discharge: Barriers Resolved               Expected Discharge Plan and Services       Living arrangements for the past 2 months: Single Family Home                                       Social Drivers of Health (SDOH) Interventions SDOH Screenings   Food Insecurity: No Food Insecurity (07/04/2024)  Housing: High Risk (07/04/2024)  Transportation Needs: No Transportation Needs (07/04/2024)  Utilities: Not At Risk (07/04/2024)  Financial Resource Strain: Low Risk (12/04/2023)   Received from [facility]  Physical Activity: Insufficiently Active (12/04/2023)   Received from [facility]  Social Connections: Moderately Isolated (07/04/2024)  Stress: No Stress Concern Present (12/04/2023)   Received from [facility]  Tobacco Use: Low Risk (07/01/2024)    Readmission Risk Interventions     No data to display

## 2024-07-07 NOTE — Discharge Summary (Signed)
 "  PATIENT DETAILS Name: Michelle Henderson Age: 68 y.o. Sex: female Date of Birth: Nov 27, 1955 MRN: [id number]. Admitting Physician: Michelle LOISE Cleaver, MD Michelle Henderson, Michelle CROME, MD  Admit Date: 07/01/2024 Discharge date: 07/07/2024  Recommendations for Outpatient Follow-up:  Follow up with PCP in 1-2 weeks Please obtain CMP/CBC in one week  Admitted From:  Home  Disposition: Skilled nursing facility   Discharge Condition: good  CODE STATUS:   Code Status: Full Code   Diet recommendation:  Diet Order             Diet heart healthy/carb modified Fluid consistency: Thin  Diet effective now                    Brief Summary: Patient is a 68 y.o.  female with history of DM-2, HTN, HLD-presented with confusion/mild RUE weakness/intermittent hand twitching-she was found to have DKA-focal seizures.   Significant events: 12/16>> admit to [facility]   Significant studies: 12/16>> CT head: No acute intracranial abnormality 12/16>> CT angio head/neck: No LVO/significant stenosis 12/16>> MRI brain: No acute intracranial abnormality 12/16>> EEG: No seizures   Significant microbiology data: None   Procedures: None   Consults: Neuro   Brief Hospital Course: DKA Resolved with IV fluids/IV insulin  Has been transition to SQ insulin .   DM-2 with uncontrolled hyperglycemia (A1c 16.7 on 12/16) CBG controlled Lantus  34 units+ 6 units of Premeal NovoLog + SSI Diabetic education/insulin  teaching has been completed by diabetic coordinator. Attending MD at SNF to optimize further.  Focal seizure Secondary to metabolic derangements-MRI brain negative for structural abnormalities-EEG negative Per neurology recommendation-no longer on Keppra    Acute metabolic encephalopathy Related to DKA/AKI/postictal state from seizures Resolved-she is back to baseline-completely awake/alert this morning   AKI Secondary to osmotic mediated kidney injury due to DKA Resolving with  IVF Doubt CKD-ruled out.   UTI No culture sent Clinically currently asymptomatic Completed course of Rocephin .   HLD Continue statin   HTN All antihypertensives held-BP remains stable Resume accordingly   Debility/deconditioning Secondary to acute illness-SNF now recommended-awaiting insurance authorization   Class 2 Obesity: Estimated body mass index is 39.14 kg/m as calculated from the following:   Height as of this encounter: 5' 2 (1.575 m).   Weight as of this encounter: 97.1 kg.     Discharge Diagnoses:  Principal Problem:   DKA (diabetic ketoacidosis) (HCC) Active Problems:   Essential hypertension   Acute encephalopathy   ARF (acute renal failure)   HLD (hyperlipidemia)   Seizure (HCC)   Erythrocytosis   UTI (urinary tract infection)   Discharge Instructions:  Activity:  As tolerated with Full fall precautions use walker/cane & assistance as needed  Discharge Instructions     AMB Referral VBCI Care Management   Complete by: As directed    Pt admitted with A1c of 16.7%.  Planning to D/C to SNF for Rehab only.  PCP: [facility].  New to Insulin  at discharge--New to Continuous Glucose sensor.  Please contact pt after she goes home from Rehab.  Thank you!   Service:  Pharmacy RN Case Management     Pharmacy Service For:  Disease Management Medication Adherence     Disease states to manage: Diabetes   RN Case Management Service For: Diabetes   Ambulatory referral to Nutrition and Diabetic Education   Complete by: As directed    A1c= 16.7%.  D/C home on Insulin .  Going to SNF for Rehab 1st before she goes home.  Please  wait and contact after pt gets out of Rehab.   Discharge instructions   Complete by: As directed    Follow with Primary MD  Michelle Michelle CROME, MD in 1-2 weeks  Please get a complete blood count and chemistry panel checked by your Primary MD at your next visit, and again as instructed by your Primary MD.  Get  Medicines reviewed and adjusted: Please take all your medications with you for your next visit with your Primary MD  Laboratory/radiological data: Please request your Primary MD to go over all hospital tests and procedure/radiological results at the follow up, please ask your Primary MD to get all Hospital records sent to his/her office.  In some cases, they will be blood work, cultures and biopsy results pending at the time of your discharge. Please request that your primary care M.D. follows up on these results.  Also Note the following: If you experience worsening of your admission symptoms, develop shortness of breath, life threatening emergency, suicidal or homicidal thoughts you must seek medical attention immediately by calling 911 or calling your MD immediately  if symptoms less severe.  You must read complete instructions/literature along with all the possible adverse reactions/side effects for all the Medicines you take and that have been prescribed to you. Take any new Medicines after you have completely understood and accpet all the possible adverse reactions/side effects.   Do not drive when taking Pain medications or sleeping medications (Benzodaizepines)  Do not take more than prescribed Pain, Sleep and Anxiety Medications. It is not advisable to combine anxiety,sleep and pain medications without talking with your primary care practitioner  Special Instructions: If you have smoked or chewed Tobacco  in the last 2 yrs please stop smoking, stop any regular Alcohol  and or any Recreational drug use.  Wear Seat belts while driving.  Please note: You were cared for by a hospitalist during your hospital stay. Once you are discharged, your primary care physician will handle any further medical issues. Please note that NO REFILLS for any discharge medications will be authorized once you are discharged, as it is imperative that you return to your primary care physician (or establish a  relationship with a primary care physician if you do not have one) for your post hospital discharge needs so that they can reassess your need for medications and monitor your lab values.   Check CBGs before meals and at bedtime   Increase activity slowly   Complete by: As directed       Allergies as of 07/07/2024       Reactions   Nsaids Other (See Comments)   Contraindication due to CKD    Penicillins Hives        Medication List     STOP taking these medications    Acidophilus/Goat Milk Caps   Calcium  250 MG Caps   hydrochlorothiazide 12.5 MG tablet Commonly known as: HYDRODIURIL   lisinopril 40 MG tablet Commonly known as: ZESTRIL   Ozempic (2 MG/DOSE) 8 MG/3ML Sopn Generic drug: Semaglutide (2 MG/DOSE)       TAKE these medications    albuterol  108 (90 Base) MCG/ACT inhaler Commonly known as: VENTOLIN  HFA Inhale 2 puffs into the lungs every 6 (six) hours as needed for shortness of breath or wheezing.   atorvastatin  20 MG tablet Commonly known as: LIPITOR Take 20 mg by mouth every other day.   CENTRUM SILVER 50+WOMEN PO Take 1 tablet by mouth daily. What changed: Another medication with the  same name was removed. Continue taking this medication, and follow the directions you see here.   fexofenadine 180 MG tablet Commonly known as: ALLEGRA Take 180 mg by mouth daily.   fluticasone  50 MCG/ACT nasal spray Commonly known as: FLONASE  Place 2 sprays into both nostrils daily.   glucosamine-chondroitin 500-400 MG tablet Take 2 tablets by mouth daily.   insulin  aspart 100 UNIT/ML injection Commonly known as: novoLOG  Inject 6 Units into the skin 3 (three) times daily with meals.   NovoLOG  FlexPen 100 UNIT/ML FlexPen Generic drug: insulin  aspart 0-9 Units, Subcutaneous, 3 times daily with meals CBG < 70: Implement Hypoglycemia measures CBG 70 - 120: 0 units CBG 121 - 150: 1 unit CBG 151 - 200: 2 units CBG 201 - 250: 3 units CBG 251 - 300: 5 units CBG 301 -  350: 7 units CBG 351 - 400: 9 units CBG > 400: call MD   insulin  glargine 100 UNIT/ML injection Commonly known as: LANTUS  Inject 0.34 mLs (34 Units total) into the skin daily. Start taking on: July 08, 2024   metFORMIN 500 MG 24 hr tablet Commonly known as: GLUCOPHAGE-XR Take 500 mg by mouth 2 (two) times daily.        Contact information for follow-up providers     Michelle Michelle CROME, MD. Schedule an appointment as soon as possible for a visit in 1 week(s).   Specialty: Family Medicine Contact information: [address] [place] [address] [phone]              Contact information for after-discharge care     Destination     [facility], [place] .   Service: Skilled Nursing Contact information: [address] [place]  [phone]                    Allergies[1]   Other Procedures/Studies: MR BRAIN WO CONTRAST Result Date: 07/01/2024 EXAM: MRI BRAIN WITHOUT CONTRAST 07/01/2024 05:05:00 PM TECHNIQUE: Multiplanar multisequence MRI of the head/brain was performed without the administration of intravenous contrast. COMPARISON: CT head and CTA head and neck earlier same day. CLINICAL HISTORY: Transient ischemic attack (TIA). FINDINGS: BRAIN AND VENTRICLES: No acute infarct. No intracranial hemorrhage. No mass. No midline shift. No hydrocephalus. Mild areas of T2 and FLAIR hyperintensity in the periventricular white matter likely related to chronic microvascular ischemic changes. Mild age related parenchymal volume loss. The sella is unremarkable. Normal flow voids. ORBITS: No acute abnormality. SINUSES AND MASTOIDS: Small right mastoid effusion. BONES AND SOFT TISSUES: Normal marrow signal. No acute soft tissue abnormality. IMPRESSION: 1. No acute intracranial abnormality. Electronically signed by: Donnice Mania MD 07/01/2024 05:39 PM EST RP Workstation: [id number]   EEG adult Result Date:  07/01/2024 Shelton Arlin KIDD, MD     07/01/2024 10:37 AM Patient Name: Lizzett Nobile MRN: [id number] Epilepsy Attending: Arlin KIDD Shelton Referring Physician/Provider: Khaliqdina, Salman, MD Date: 07/01/2024 Duration: 22.07 mins Patient history:  68 y.o. female who was found in her car in the parking lot of an elementary school. Noted to have aphasia slightly out of proportion to confusion along with mild RUE weakness and R hand intermittent twitching. EEG to evaluate for seizure Level of alertness: Awake AEDs during EEG study: LEV Technical aspects: This EEG study was done with scalp electrodes positioned according to the 10-20 International system of electrode placement. Electrical activity was reviewed with band pass filter of 1-70Hz , sensitivity of 7 uV/mm, display speed of 44mm/sec with  a 60Hz  notched filter applied as appropriate. EEG data were recorded continuously and digitally stored.  Video monitoring was available and reviewed as appropriate. Description: EEG showed continuous generalized predominantly 5 to 7 Hz theta slowing admixed with intermittent 2-3hz  delta slowing. Hyperventilation and photic stimulation were not performed.   ABNORMALITY - Continuous slow, generalized IMPRESSION: This study is suggestive of generalized cerebral dysfunction (encephalopathy). No seizures or epileptiform discharges were seen throughout the recording. Priyanka O Yadav   CT ANGIO HEAD NECK W WO CM (CODE STROKE) Result Date: 07/01/2024 EXAM: CTA Head and Neck with Intravenous Contrast. CT Head without Contrast. CLINICAL HISTORY: Neuro deficit, acute, stroke suspected. TECHNIQUE: Axial CTA images of the head and neck performed with intravenous contrast. MIP reconstructed images were created and reviewed. Axial computed tomography images of the head/brain performed without intravenous contrast. Note: Per PQRS, the description of internal carotid artery percent stenosis, including 0 percent or normal exam, is  based on North American Symptomatic Carotid Endarterectomy Trial (NASCET) criteria. Dose reduction technique was used including one or more of the following: automated exposure control, adjustment of mA and kV according to patient size, and/or iterative reconstruction. CONTRAST: With; COMPARISON: CT from 07/01/2024. FINDINGS: CT HEAD: BRAIN: No acute intraparenchymal hemorrhage. No mass lesion. No CT evidence for acute territorial infarct. No midline shift or extra-axial collection. VENTRICLES: No hydrocephalus. ORBITS: The orbits are unremarkable. SINUSES AND MASTOIDS: The paranasal sinuses and mastoid air cells are clear. CTA NECK: COMMON CAROTID ARTERIES: Mild atheromatous change about the right carotid bulb without stenosis. No significant stenosis of the left common carotid artery. No dissection or occlusion. INTERNAL CAROTID ARTERIES: No stenosis by NASCET criteria. No dissection or occlusion. VERTEBRAL ARTERIES: No significant stenosis. No dissection or occlusion. CTA HEAD: ANTERIOR CEREBRAL ARTERIES: No significant stenosis. No occlusion. No aneurysm. MIDDLE CEREBRAL ARTERIES: No significant stenosis. No occlusion. No aneurysm. POSTERIOR CEREBRAL ARTERIES: No significant stenosis. No occlusion. No aneurysm. BASILAR ARTERY: No significant stenosis. No occlusion. No aneurysm. OTHER: SOFT TISSUES: No acute finding. No masses or lymphadenopathy. BONES: Moderate multilevel cervical spondylosis, most pronounced at C7-T1. No acute osseous abnormality of the visualized portions of the skull. IMPRESSION: 1. Negative CTA for acute . LVO or other abnormality. 2. Findings communicated by text page to Dr. Vanessa at 1:13 am on 07/01/2024. Electronically signed by: Morene Hoard MD 07/01/2024 01:17 AM EST RP Workstation: [id number]   CT HEAD CODE STROKE WO CONTRAST Result Date: 07/01/2024 EXAM: CT HEAD WITHOUT CONTRAST 07/01/2024 12:57:46 AM TECHNIQUE: CT of the head was performed without the administration  of intravenous contrast. Automated exposure control, iterative reconstruction, and/or weight based adjustment of the mA/kV was utilized to reduce the radiation dose to as low as reasonably achievable. COMPARISON: None available. CLINICAL HISTORY: Neuro deficit, acute, stroke suspected Neuro deficit, acute, stroke suspected FINDINGS: BRAIN AND VENTRICLES: No acute hemorrhage. No evidence of acute infarct. No hydrocephalus. No extra-axial collection. No mass effect or midline shift. ORBITS: No acute abnormality. SINUSES: No acute abnormality. SOFT TISSUES AND SKULL: No acute soft tissue abnormality. No skull fracture. IMPRESSION: 1. No acute intracranial abnormality. 2. Aspects is 10. 3. Findings communicated by text page to Dr. Vanessa at 1:01 am on 07/01/2024. Electronically signed by: Morene Hoard MD 07/01/2024 01:02 AM EST RP Workstation: [id number]     TODAY-DAY OF DISCHARGE:  Subjective:   Michelle Henderson today has no headache,no chest abdominal pain,no new weakness tingling or numbness, feels much better wants to go home today.  Objective:   Blood pressure 136/70,  pulse 76, temperature (!) (P) 97.5 F (36.4 C), temperature source (P) Oral, resp. rate (P) 16, height 5' 2 (1.575 m), weight 97.1 kg, SpO2 98%. No intake or output data in the 24 hours ending 07/07/24 1153 Filed Weights   07/04/24 1500  Weight: 97.1 kg    Exam: Awake Alert, Oriented *3, No new F.N deficits, Normal affect [place].AT,PERRAL Supple Neck,No JVD, No cervical lymphadenopathy appriciated.  Symmetrical Chest wall movement, Good air movement bilaterally, CTAB RRR,No Gallops,Rubs or new Murmurs, No Parasternal Heave +ve B.Sounds, Abd Soft, Non tender, No organomegaly appriciated, No rebound -guarding or rigidity. No Cyanosis, Clubbing or edema, No new Rash or bruise   PERTINENT RADIOLOGIC STUDIES: No results found.   PERTINENT LAB RESULTS: CBC: No results for input(s): WBC, HGB, HCT, PLT in the  last 72 hours. CMET CMP     Component Value Date/Time   NA 141 07/04/2024 0317   K 3.5 07/04/2024 0317   CL 108 07/04/2024 0317   CO2 23 07/04/2024 0317   GLUCOSE 165 (H) 07/04/2024 0317   BUN 29 (H) 07/04/2024 0317   CREATININE 1.23 (H) 07/04/2024 0317   CALCIUM  8.4 (L) 07/04/2024 0317   PROT 4.8 (L) 07/03/2024 0408   ALBUMIN 2.8 (L) 07/03/2024 0408   AST 27 07/03/2024 0408   ALT 46 (H) 07/03/2024 0408   ALKPHOS 108 07/03/2024 0408   BILITOT 0.7 07/03/2024 0408   GFRNONAA 47 (L) 07/04/2024 0317    GFR Estimated Creatinine Clearance: 46.3 mL/min (A) (by C-G formula based on SCr of 1.23 mg/dL (H)). No results for input(s): LIPASE, AMYLASE in the last 72 hours. No results for input(s): CKTOTAL, CKMB, CKMBINDEX, TROPONINI in the last 72 hours. Invalid input(s): POCBNP No results for input(s): DDIMER in the last 72 hours. No results for input(s): HGBA1C in the last 72 hours. No results for input(s): CHOL, HDL, LDLCALC, TRIG, CHOLHDL, LDLDIRECT in the last 72 hours. No results for input(s): TSH, T4TOTAL, T3FREE, THYROIDAB in the last 72 hours.  Invalid input(s): FREET3 No results for input(s): VITAMINB12, FOLATE, FERRITIN, TIBC, IRON, RETICCTPCT in the last 72 hours. Coags: No results for input(s): INR in the last 72 hours.  Invalid input(s): PT Microbiology: No results found for this or any previous visit (from the past 240 hours).  FURTHER DISCHARGE INSTRUCTIONS:  Get Medicines reviewed and adjusted: Please take all your medications with you for your next visit with your Primary MD  Laboratory/radiological data: Please request your Primary MD to go over all hospital tests and procedure/radiological results at the follow up, please ask your Primary MD to get all Hospital records sent to his/her office.  In some cases, they will be blood work, cultures and biopsy results pending at the time of your discharge. Please  request that your primary care M.D. goes through all the records of your hospital data and follows up on these results.  Also Note the following: If you experience worsening of your admission symptoms, develop shortness of breath, life threatening emergency, suicidal or homicidal thoughts you must seek medical attention immediately by calling 911 or calling your MD immediately  if symptoms less severe.  You must read complete instructions/literature along with all the possible adverse reactions/side effects for all the Medicines you take and that have been prescribed to you. Take any new Medicines after you have completely understood and accpet all the possible adverse reactions/side effects.   Do not drive when taking Pain medications or sleeping medications (Benzodaizepines)  Do not take more  than prescribed Pain, Sleep and Anxiety Medications. It is not advisable to combine anxiety,sleep and pain medications without talking with your primary care practitioner  Special Instructions: If you have smoked or chewed Tobacco  in the last 2 yrs please stop smoking, stop any regular Alcohol  and or any Recreational drug use.  Wear Seat belts while driving.  Please note: You were cared for by a hospitalist during your hospital stay. Once you are discharged, your primary care physician will handle any further medical issues. Please note that NO REFILLS for any discharge medications will be authorized once you are discharged, as it is imperative that you return to your primary care physician (or establish a relationship with a primary care physician if you do not have one) for your post hospital discharge needs so that they can reassess your need for medications and monitor your lab values.  Total Time spent coordinating discharge including counseling, education and face to face time equals greater than 30 minutes.  Signed: Donalda Applebaum 07/07/2024 11:53 AM      [1]  Allergies Allergen  Reactions   Nsaids Other (See Comments)    Contraindication due to CKD    Penicillins Hives   "

## 2024-07-07 NOTE — Plan of Care (Signed)
  Problem: Education: Goal: Ability to describe self-care measures that may prevent or decrease complications (Diabetes Survival Skills Education) will improve Outcome: Not Progressing Goal: Individualized Educational Video(s) Outcome: Not Progressing   Problem: Coping: Goal: Ability to adjust to condition or change in health will improve Outcome: Not Progressing   Problem: Fluid Volume: Goal: Ability to maintain a balanced intake and output will improve Outcome: Not Progressing   Problem: Health Behavior/Discharge Planning: Goal: Ability to identify and utilize available resources and services will improve Outcome: Not Progressing Goal: Ability to manage health-related needs will improve Outcome: Not Progressing   Problem: Metabolic: Goal: Ability to maintain appropriate glucose levels will improve Outcome: Not Progressing   Problem: Nutritional: Goal: Maintenance of adequate nutrition will improve Outcome: Not Progressing Goal: Progress toward achieving an optimal weight will improve Outcome: Not Progressing   Problem: Skin Integrity: Goal: Risk for impaired skin integrity will decrease Outcome: Not Progressing   Problem: Tissue Perfusion: Goal: Adequacy of tissue perfusion will improve Outcome: Not Progressing   Problem: Education: Goal: Ability to describe self-care measures that may prevent or decrease complications (Diabetes Survival Skills Education) will improve Outcome: Not Progressing Goal: Individualized Educational Video(s) Outcome: Not Progressing   Problem: Cardiac: Goal: Ability to maintain an adequate cardiac output will improve Outcome: Not Progressing   Problem: Health Behavior/Discharge Planning: Goal: Ability to identify and utilize available resources and services will improve Outcome: Not Progressing Goal: Ability to manage health-related needs will improve Outcome: Not Progressing   Problem: Fluid Volume: Goal: Ability to achieve a  balanced intake and output will improve Outcome: Not Progressing   Problem: Metabolic: Goal: Ability to maintain appropriate glucose levels will improve Outcome: Not Progressing   Problem: Nutritional: Goal: Maintenance of adequate nutrition will improve Outcome: Not Progressing Goal: Maintenance of adequate weight for body size and type will improve Outcome: Not Progressing   Problem: Respiratory: Goal: Will regain and/or maintain adequate ventilation Outcome: Not Progressing   Problem: Urinary Elimination: Goal: Ability to achieve and maintain adequate renal perfusion and functioning will improve Outcome: Not Progressing   Problem: Education: Goal: Knowledge of General Education information will improve Description: Including pain rating scale, medication(s)/side effects and non-pharmacologic comfort measures Outcome: Not Progressing   Problem: Health Behavior/Discharge Planning: Goal: Ability to manage health-related needs will improve Outcome: Not Progressing   Problem: Clinical Measurements: Goal: Ability to maintain clinical measurements within normal limits will improve Outcome: Not Progressing Goal: Will remain free from infection Outcome: Not Progressing Goal: Diagnostic test results will improve Outcome: Not Progressing Goal: Respiratory complications will improve Outcome: Not Progressing Goal: Cardiovascular complication will be avoided Outcome: Not Progressing   Problem: Activity: Goal: Risk for activity intolerance will decrease Outcome: Not Progressing   Problem: Nutrition: Goal: Adequate nutrition will be maintained Outcome: Not Progressing   Problem: Coping: Goal: Level of anxiety will decrease Outcome: Not Progressing   Problem: Elimination: Goal: Will not experience complications related to bowel motility Outcome: Not Progressing Goal: Will not experience complications related to urinary retention Outcome: Not Progressing   Problem: Pain  Managment: Goal: General experience of comfort will improve and/or be controlled Outcome: Not Progressing   Problem: Safety: Goal: Ability to remain free from injury will improve Outcome: Not Progressing   Problem: Skin Integrity: Goal: Risk for impaired skin integrity will decrease Outcome: Not Progressing

## 2024-07-07 NOTE — Plan of Care (Signed)
 Problem: Education: Goal: Ability to describe self-care measures that may prevent or decrease complications (Diabetes Survival Skills Education) will improve Outcome: Adequate for Discharge Goal: Individualized Educational Video(s) Outcome: Adequate for Discharge   Problem: Coping: Goal: Ability to adjust to condition or change in health will improve Outcome: Adequate for Discharge   Problem: Fluid Volume: Goal: Ability to maintain a balanced intake and output will improve Outcome: Adequate for Discharge   Problem: Health Behavior/Discharge Planning: Goal: Ability to identify and utilize available resources and services will improve Outcome: Adequate for Discharge Goal: Ability to manage health-related needs will improve Outcome: Adequate for Discharge   Problem: Metabolic: Goal: Ability to maintain appropriate glucose levels will improve Outcome: Adequate for Discharge   Problem: Nutritional: Goal: Maintenance of adequate nutrition will improve Outcome: Adequate for Discharge Goal: Progress toward achieving an optimal weight will improve Outcome: Adequate for Discharge   Problem: Skin Integrity: Goal: Risk for impaired skin integrity will decrease Outcome: Adequate for Discharge   Problem: Tissue Perfusion: Goal: Adequacy of tissue perfusion will improve Outcome: Adequate for Discharge   Problem: Education: Goal: Ability to describe self-care measures that may prevent or decrease complications (Diabetes Survival Skills Education) will improve Outcome: Adequate for Discharge Goal: Individualized Educational Video(s) Outcome: Adequate for Discharge   Problem: Cardiac: Goal: Ability to maintain an adequate cardiac output will improve Outcome: Adequate for Discharge   Problem: Health Behavior/Discharge Planning: Goal: Ability to identify and utilize available resources and services will improve Outcome: Adequate for Discharge Goal: Ability to manage health-related  needs will improve Outcome: Adequate for Discharge   Problem: Fluid Volume: Goal: Ability to achieve a balanced intake and output will improve Outcome: Adequate for Discharge   Problem: Metabolic: Goal: Ability to maintain appropriate glucose levels will improve Outcome: Adequate for Discharge   Problem: Nutritional: Goal: Maintenance of adequate nutrition will improve Outcome: Adequate for Discharge Goal: Maintenance of adequate weight for body size and type will improve Outcome: Adequate for Discharge   Problem: Respiratory: Goal: Will regain and/or maintain adequate ventilation Outcome: Adequate for Discharge   Problem: Urinary Elimination: Goal: Ability to achieve and maintain adequate renal perfusion and functioning will improve Outcome: Adequate for Discharge   Problem: Education: Goal: Knowledge of General Education information will improve Description: Including pain rating scale, medication(s)/side effects and non-pharmacologic comfort measures Outcome: Adequate for Discharge   Problem: Health Behavior/Discharge Planning: Goal: Ability to manage health-related needs will improve Outcome: Adequate for Discharge   Problem: Clinical Measurements: Goal: Ability to maintain clinical measurements within normal limits will improve Outcome: Adequate for Discharge Goal: Will remain free from infection Outcome: Adequate for Discharge Goal: Diagnostic test results will improve Outcome: Adequate for Discharge Goal: Respiratory complications will improve Outcome: Adequate for Discharge Goal: Cardiovascular complication will be avoided Outcome: Adequate for Discharge   Problem: Activity: Goal: Risk for activity intolerance will decrease Outcome: Adequate for Discharge   Problem: Nutrition: Goal: Adequate nutrition will be maintained Outcome: Adequate for Discharge   Problem: Coping: Goal: Level of anxiety will decrease Outcome: Adequate for Discharge   Problem:  Elimination: Goal: Will not experience complications related to bowel motility Outcome: Adequate for Discharge Goal: Will not experience complications related to urinary retention Outcome: Adequate for Discharge   Problem: Pain Managment: Goal: General experience of comfort will improve and/or be controlled Outcome: Adequate for Discharge   Problem: Safety: Goal: Ability to remain free from injury will improve Outcome: Adequate for Discharge   Problem: Skin Integrity: Goal: Risk for impaired skin  integrity will decrease Outcome: Adequate for Discharge

## 2024-07-08 LAB — GLUCOSE, CAPILLARY
Glucose-Capillary: 120 mg/dL — ABNORMAL HIGH (ref 70–99)
Glucose-Capillary: 134 mg/dL — ABNORMAL HIGH (ref 70–99)

## 2024-07-15 ENCOUNTER — Other Ambulatory Visit: Payer: Self-pay

## 2024-07-15 NOTE — Progress Notes (Signed)
" ° °  07/15/2024 Name: Michelle Henderson MRN: [id number] DOB: [birth year]  Chief Complaint  Patient presents with   Diabetes    Transitions of Care Follow-up   Patient referred to [facility] team due to uncontrolled diabetes on admission. Patient's primary care provider is Dr. Erminio Sensing at [facility].  Current antihyperglycemics: Novolog  6 units TID with meals, Novolog  SSI, Lantus  34 units daily  Patient was discharged to SNF. Outreached patient to discuss diabetes control and medication management along with possible SNF discharge plan. Unable to contact patient to confirm SNF discharge plan and PCP follow-up. Voicemail was not available to leave a message for call back.  Jenkins Graces, PharmD PGY1 Pharmacy Resident "

## 2024-07-29 ENCOUNTER — Encounter: Admitting: Skilled Nursing Facility1

## 2024-08-11 ENCOUNTER — Encounter: Admitting: Skilled Nursing Facility1

## 2024-09-10 ENCOUNTER — Encounter: Admitting: Skilled Nursing Facility1
